# Patient Record
Sex: Male | Born: 1980 | Race: Asian | Hispanic: No | Marital: Single | Smoking: Former smoker
Health system: Southern US, Community
[De-identification: ages and names within clinical notes are randomized; demographics above are authoritative.]

## PROBLEM LIST (undated history)

## (undated) DIAGNOSIS — J189 Pneumonia, unspecified organism: Secondary | ICD-10-CM

## (undated) DIAGNOSIS — I209 Angina pectoris, unspecified: Secondary | ICD-10-CM

## (undated) DIAGNOSIS — R0600 Dyspnea, unspecified: Secondary | ICD-10-CM

## (undated) DIAGNOSIS — F419 Anxiety disorder, unspecified: Secondary | ICD-10-CM

## (undated) DIAGNOSIS — D649 Anemia, unspecified: Secondary | ICD-10-CM

## (undated) HISTORY — DX: Anemia, unspecified: D64.9

---

## 2012-11-08 ENCOUNTER — Encounter (HOSPITAL_BASED_OUTPATIENT_CLINIC_OR_DEPARTMENT_OTHER): Payer: Self-pay | Admitting: *Deleted

## 2012-11-08 ENCOUNTER — Emergency Department (HOSPITAL_BASED_OUTPATIENT_CLINIC_OR_DEPARTMENT_OTHER): Payer: Worker's Compensation

## 2012-11-08 ENCOUNTER — Emergency Department (HOSPITAL_BASED_OUTPATIENT_CLINIC_OR_DEPARTMENT_OTHER)
Admission: EM | Admit: 2012-11-08 | Discharge: 2012-11-08 | Disposition: A | Discharge status: Home / Self Care | Payer: Worker's Compensation | Attending: Emergency Medicine | Admitting: Emergency Medicine

## 2012-11-08 DIAGNOSIS — W268XXA Contact with other sharp object(s), not elsewhere classified, initial encounter: Secondary | ICD-10-CM | POA: Insufficient documentation

## 2012-11-08 DIAGNOSIS — F17200 Nicotine dependence, unspecified, uncomplicated: Secondary | ICD-10-CM | POA: Insufficient documentation

## 2012-11-08 DIAGNOSIS — S91109A Unspecified open wound of unspecified toe(s) without damage to nail, initial encounter: Secondary | ICD-10-CM | POA: Insufficient documentation

## 2012-11-08 DIAGNOSIS — S96909A Unspecified injury of unspecified muscle and tendon at ankle and foot level, unspecified foot, initial encounter: Secondary | ICD-10-CM | POA: Insufficient documentation

## 2012-11-08 DIAGNOSIS — IMO0002 Reserved for concepts with insufficient information to code with codable children: Secondary | ICD-10-CM

## 2012-11-08 DIAGNOSIS — Y9389 Activity, other specified: Secondary | ICD-10-CM | POA: Insufficient documentation

## 2012-11-08 DIAGNOSIS — Z23 Encounter for immunization: Secondary | ICD-10-CM | POA: Insufficient documentation

## 2012-11-08 DIAGNOSIS — S92919B Unspecified fracture of unspecified toe(s), initial encounter for open fracture: Secondary | ICD-10-CM | POA: Insufficient documentation

## 2012-11-08 DIAGNOSIS — Y9289 Other specified places as the place of occurrence of the external cause: Secondary | ICD-10-CM | POA: Insufficient documentation

## 2012-11-08 DIAGNOSIS — Y990 Civilian activity done for income or pay: Secondary | ICD-10-CM

## 2012-11-08 MED ORDER — CEFAZOLIN SODIUM 1-5 GM-% IV SOLN
1.0000 g | Freq: Once | INTRAVENOUS | Status: AC
  Administered 2012-11-08: 1 g via INTRAVENOUS

## 2012-11-08 MED ORDER — HYDROMORPHONE HCL PF 1 MG/ML IJ SOLN
1.0000 mg | Freq: Once | INTRAMUSCULAR | Status: AC
  Administered 2012-11-08: 1 mg via INTRAVENOUS

## 2012-11-08 MED ORDER — BUPIVACAINE HCL (PF) 0.5 % IJ SOLN
20.0000 mL | Freq: Once | INTRAMUSCULAR | Status: AC
  Administered 2012-11-08: 10 mL
  Filled 2012-11-08: qty 10

## 2012-11-08 MED ORDER — TETANUS-DIPHTH-ACELL PERTUSSIS 5-2.5-18.5 LF-MCG/0.5 IM SUSP
0.5000 mL | Freq: Once | INTRAMUSCULAR | Status: AC
  Administered 2012-11-08: 0.5 mL via INTRAMUSCULAR
  Filled 2012-11-08: qty 0.5

## 2012-11-08 MED ORDER — ONDANSETRON HCL 4 MG/2ML IJ SOLN
4.0000 mg | Freq: Once | INTRAMUSCULAR | Status: AC
  Administered 2012-11-08: 4 mg via INTRAVENOUS

## 2012-11-08 MED ORDER — OXYCODONE-ACETAMINOPHEN 5-325 MG PO TABS: 1.0000 | ORAL_TABLET | ORAL | Status: DC | PRN

## 2012-11-08 MED ORDER — IBUPROFEN 600 MG PO TABS: 600.0000 mg | ORAL_TABLET | Freq: Four times a day (QID) | ORAL | Status: DC | PRN

## 2012-11-08 MED ORDER — ONDANSETRON HCL 4 MG/2ML IJ SOLN
INTRAMUSCULAR | Status: AC
  Filled 2012-11-08: qty 2

## 2012-11-08 MED ORDER — HYDROMORPHONE HCL PF 1 MG/ML IJ SOLN
INTRAMUSCULAR | Status: AC
  Filled 2012-11-08: qty 1

## 2012-11-08 MED ORDER — CIPROFLOXACIN HCL 500 MG PO TABS: 500.0000 mg | ORAL_TABLET | Freq: Two times a day (BID) | ORAL | Status: DC

## 2012-11-08 MED ORDER — CEFAZOLIN SODIUM 1 G IJ SOLR
INTRAMUSCULAR | Status: AC
  Filled 2012-11-08: qty 10

## 2012-11-08 MED ORDER — AMOXICILLIN-POT CLAVULANATE 875-125 MG PO TABS: 1.0000 | ORAL_TABLET | Freq: Two times a day (BID) | ORAL | Status: DC

## 2012-11-08 NOTE — ED Notes (Signed)
Patient transported to X-ray 

## 2012-11-08 NOTE — ED Provider Notes (Signed)
History     CSN: [id number]  Arrival date & time 11/08/12  1607   First MD Initiated Contact with Patient 11/08/12 1632      Chief Complaint  Patient presents with  . Foot Injury    (Consider location/radiation/quality/duration/timing/severity/associated sxs/prior treatment) HPIChantha Velez is a 31 y.o. male was working at [employer]" and got a large, heavy metal rod on his left foot while wearing tennis shoes at work. Patient says it's the tissue he had some blood with her sawed, he is complaining of 10 out of 10 severe pain, sharp and throbbing, well localized to the left foot, this is worse on walking. The patient did not remove his shoe or sock until he came to the emergency department.   History reviewed. No pertinent past medical history.  History reviewed. No pertinent past surgical history.  History reviewed. No pertinent family history.  History  Substance Use Topics  . Smoking status: Current Every Day Smoker  . Smokeless tobacco: Not on file  . Alcohol Use:       Review of Systems At least 10pt or greater review of systems completed and are negative except where specified in the HPI.  Allergies  Review of patient's allergies indicates no known allergies.  Home Medications  No current outpatient prescriptions on file.  BP 133/87  Pulse 97  Temp 97.9 F (36.6 C) (Oral)  Resp 18  SpO2 100%  Physical Exam  Nursing notes reviewed.  Electronic medical record reviewed. VITAL SIGNS:   Filed Vitals:   11/08/12 1617  BP: 133/87  Pulse: 97  Temp: 97.9 F (36.6 C)  TempSrc: Oral  Resp: 18  SpO2: 100%   CONSTITUTIONAL: Awake, oriented, appears non-toxic HENT: Atraumatic, normocephalic, oral mucosa pink and moist, airway patent. Nares patent without drainage. External ears normal. EYES: Conjunctiva clear, EOMI, PERRLA NECK: Trachea midline, non-tender, supple CARDIOVASCULAR: Normal heart rate, Normal rhythm, No murmurs, rubs, gallops PULMONARY/CHEST:  Clear to auscultation, no rhonchi, wheezes, or rales. Symmetrical breath sounds. Non-tender. ABDOMINAL: Non-distended, soft, non-tender - no rebound or guarding.  BS normal. NEUROLOGIC: Non-focal, moving all four extremities, no gross sensory or motor deficits. EXTREMITIES: No clubbing, cyanosis, or edema. Second toe on left foot has multiple lacerations, toenail, and nailbed is disrupted, swollen and painful to touch. Bone fragment seen in nailbed. Sensation intact, patient is able to wiggle all of his toes. SKIN: Warm, Dry, No erythema, No rash  ED Course  LACERATION REPAIR Performed by: Christopher Velez Authorized by: Christopher Velez Consent: Verbal consent obtained. Risks and benefits: risks, benefits and alternatives were discussed Consent given by: patient Patient identity confirmed: verbally with patient Time out: Immediately prior to procedure a "time out" was called to verify the correct patient, procedure, equipment, support staff and site/side marked as required. Body area: lower extremity Location details: left second toe Laceration length: 3 cm Foreign bodies: no foreign bodies Tendon involvement: No obvious tendon damage on the dorsal aspect. Vascular damage: no Anesthesia: digital block Local anesthetic: bupivacaine 0.5% without epinephrine Patient sedated: no Preparation: Patient was prepped and draped in the usual sterile fashion. Irrigation solution: tap water Irrigation method: tap Amount of cleaning: extensive (10 minutes under tap) Debridement: minimal Degree of undermining: none Skin closure: 4-0 nylon Subcutaneous closure: 6-0 Chromic gut (Nail bed) Number of sutures: 4 Technique: simple Approximation: loose Approximation difficulty: complex Dressing: antibiotic ointment and 4x4 sterile gauze Patient tolerance: Patient tolerated the procedure well with no immediate complications.   (including critical care time)  Labs  Reviewed - No data to display Dg  Foot Complete Left  11/08/2012  *RADIOLOGY REPORT*  Clinical Data: Dropped heavy object on foot  LEFT FOOT - COMPLETE 3+ VIEW  Comparison: None.  Findings: There is crush fracture of the distal phalanx of the left second digit with soft tissue swelling.  The fracture does extend to the left second DIP joint space.  No other acute abnormality is seen.  Tarsal - metatarsal alignment is normal.  IMPRESSION: Crush fracture of the distal phalanx of the left second digit extending intra-articular.   Original Report Authenticated By: Christopher Velez, M.D.      1. Open fracture of distal phalangeal tuft   2. Nailbed injury   3. Work related injury     Medications  oxyCODONE-acetaminophen (PERCOCET/ROXICET) 5-325 MG per tablet (not administered)  ibuprofen (ADVIL,MOTRIN) 600 MG tablet (not administered)  amoxicillin-clavulanate (AUGMENTIN) 875-125 MG per tablet (not administered)  ciprofloxacin (CIPRO) 500 MG tablet (not administered)  TDaP (BOOSTRIX) injection 0.5 mL (0.5 mL Intramuscular Given 11/08/12 1640)  ondansetron (ZOFRAN) injection 4 mg (4 mg Intravenous Given 11/08/12 1641)  HYDROmorphone (DILAUDID) injection 1 mg (1 mg Intravenous Given 11/08/12 1641)  bupivacaine (MARCAINE) 0.5 % injection 20 mL (10 mL Infiltration Given 11/08/12 1705)  ceFAZolin (ANCEF) IVPB 1 g/50 mL premix (0 g Intravenous Stopped 11/08/12 1815)    MDM  Christopher Velez is a 31 y.o. male presents with severe toe injury. Given the patient IV antibiotics. Have extensively irrigated the patient's wound 10 minutes under the tap after a digital block. The nail is disrupted and removed, is not amenable to placing back on top of the nail bed. It is a good likelihood of the germinal matrix being damaged and not sure the nail will grow back. Discussed patient with Dr. Ranell Patrick on call who advised giving Augmentin by mouth as an outpatient.  Patient received Ancef and by mouth Cipro in the ER.  Nailbed is significantly injured this was  approximated with a few small gauge chromic gut sutures. One suture was placed through the large laceration through the pulp of the second digit - to help approximate but still allow the tissue to swell and drain.  Patient is [place] but speaks good English and does understand reasons to return to the emergency department including times of infection or neurovascular compromise.  I explained the diagnosis and have given explicit precautions to return to the ER including any other new or worsening symptoms. The patient understands and accepts the medical plan as it's been dictated and I have answered their questions. Discharge instructions concerning home care and prescriptions have been given.  The patient is STABLE and is discharged to home in good condition.          Christopher Skene, MD 11/10/12 [phone]

## 2012-11-08 NOTE — ED Notes (Signed)
Pt to triage in w/c, reporting dropped a large, heavy, metal object onto his left foot at work. Pt noticed it split his shoe and when he removed his shoe saw blood. Shoe is in place at triage.

## 2018-03-10 ENCOUNTER — Other Ambulatory Visit: Payer: Self-pay

## 2018-03-10 ENCOUNTER — Emergency Department (HOSPITAL_COMMUNITY): Payer: Self-pay

## 2018-03-10 ENCOUNTER — Encounter (HOSPITAL_COMMUNITY): Payer: Self-pay

## 2018-03-10 ENCOUNTER — Inpatient Hospital Stay (HOSPITAL_COMMUNITY)
Admission: EM | Admit: 2018-03-10 | Discharge: 2018-03-15 | DRG: 871 | Disposition: A | Discharge status: Home / Self Care | Payer: Self-pay | Attending: Internal Medicine | Admitting: Internal Medicine

## 2018-03-10 DIAGNOSIS — F17200 Nicotine dependence, unspecified, uncomplicated: Secondary | ICD-10-CM | POA: Diagnosis present

## 2018-03-10 DIAGNOSIS — E876 Hypokalemia: Secondary | ICD-10-CM | POA: Diagnosis present

## 2018-03-10 DIAGNOSIS — E871 Hypo-osmolality and hyponatremia: Secondary | ICD-10-CM

## 2018-03-10 DIAGNOSIS — A4189 Other specified sepsis: Principal | ICD-10-CM | POA: Diagnosis present

## 2018-03-10 DIAGNOSIS — J1008 Influenza due to other identified influenza virus with other specified pneumonia: Secondary | ICD-10-CM | POA: Diagnosis present

## 2018-03-10 DIAGNOSIS — Z79891 Long term (current) use of opiate analgesic: Secondary | ICD-10-CM

## 2018-03-10 DIAGNOSIS — R748 Abnormal levels of other serum enzymes: Secondary | ICD-10-CM | POA: Diagnosis present

## 2018-03-10 DIAGNOSIS — Z791 Long term (current) use of non-steroidal anti-inflammatories (NSAID): Secondary | ICD-10-CM

## 2018-03-10 DIAGNOSIS — J9601 Acute respiratory failure with hypoxia: Secondary | ICD-10-CM

## 2018-03-10 DIAGNOSIS — Z79899 Other long term (current) drug therapy: Secondary | ICD-10-CM

## 2018-03-10 DIAGNOSIS — A419 Sepsis, unspecified organism: Secondary | ICD-10-CM

## 2018-03-10 DIAGNOSIS — E872 Acidosis: Secondary | ICD-10-CM | POA: Diagnosis present

## 2018-03-10 DIAGNOSIS — Z792 Long term (current) use of antibiotics: Secondary | ICD-10-CM

## 2018-03-10 DIAGNOSIS — F10230 Alcohol dependence with withdrawal, uncomplicated: Secondary | ICD-10-CM | POA: Diagnosis present

## 2018-03-10 DIAGNOSIS — J189 Pneumonia, unspecified organism: Secondary | ICD-10-CM | POA: Diagnosis present

## 2018-03-10 DIAGNOSIS — E222 Syndrome of inappropriate secretion of antidiuretic hormone: Secondary | ICD-10-CM | POA: Diagnosis present

## 2018-03-10 DIAGNOSIS — D1803 Hemangioma of intra-abdominal structures: Secondary | ICD-10-CM | POA: Diagnosis present

## 2018-03-10 DIAGNOSIS — J181 Lobar pneumonia, unspecified organism: Secondary | ICD-10-CM | POA: Diagnosis present

## 2018-03-10 DIAGNOSIS — K760 Fatty (change of) liver, not elsewhere classified: Secondary | ICD-10-CM | POA: Diagnosis present

## 2018-03-10 DIAGNOSIS — J111 Influenza due to unidentified influenza virus with other respiratory manifestations: Secondary | ICD-10-CM

## 2018-03-10 DIAGNOSIS — D649 Anemia, unspecified: Secondary | ICD-10-CM | POA: Diagnosis present

## 2018-03-10 DIAGNOSIS — F10930 Alcohol use, unspecified with withdrawal, uncomplicated: Secondary | ICD-10-CM

## 2018-03-10 DIAGNOSIS — J209 Acute bronchitis, unspecified: Secondary | ICD-10-CM | POA: Diagnosis present

## 2018-03-10 DIAGNOSIS — R945 Abnormal results of liver function studies: Secondary | ICD-10-CM | POA: Diagnosis present

## 2018-03-10 HISTORY — DX: Pneumonia, unspecified organism: J18.9

## 2018-03-10 HISTORY — DX: Angina pectoris, unspecified: I20.9

## 2018-03-10 HISTORY — DX: Dyspnea, unspecified: R06.00

## 2018-03-10 HISTORY — DX: Anxiety disorder, unspecified: F41.9

## 2018-03-10 LAB — RAPID URINE DRUG SCREEN, HOSP PERFORMED
Amphetamines: NOT DETECTED
BENZODIAZEPINES: NOT DETECTED
Barbiturates: NOT DETECTED
Cocaine: NOT DETECTED
OPIATES: NOT DETECTED
Tetrahydrocannabinol: NOT DETECTED

## 2018-03-10 LAB — URINALYSIS, ROUTINE W REFLEX MICROSCOPIC
Glucose, UA: NEGATIVE mg/dL
Ketones, ur: 5 mg/dL — AB
Leukocytes, UA: NEGATIVE
Nitrite: NEGATIVE
PROTEIN: 30 mg/dL — AB
SQUAMOUS EPITHELIAL / LPF: NONE SEEN
Specific Gravity, Urine: 1.01 (ref 1.005–1.030)
pH: 6 (ref 5.0–8.0)

## 2018-03-10 LAB — INFLUENZA PANEL BY PCR (TYPE A & B)
INFLAPCR: POSITIVE — AB
Influenza B By PCR: NEGATIVE

## 2018-03-10 LAB — CBC WITH DIFFERENTIAL/PLATELET
Band Neutrophils: 16 %
Basophils Absolute: 0 10*3/uL (ref 0.0–0.1)
Basophils Relative: 0 %
Eosinophils Absolute: 0 10*3/uL (ref 0.0–0.7)
Eosinophils Relative: 0 %
HCT: 34.3 % — ABNORMAL LOW (ref 39.0–52.0)
Hemoglobin: 11.5 g/dL — ABNORMAL LOW (ref 13.0–17.0)
LYMPHS PCT: 8 %
Lymphs Abs: 1.9 10*3/uL (ref 0.7–4.0)
MCH: 25.3 pg — AB (ref 26.0–34.0)
MCHC: 33.5 g/dL (ref 30.0–36.0)
MCV: 75.6 fL — ABNORMAL LOW (ref 78.0–100.0)
MONOS PCT: 18 %
Monocytes Absolute: 4.2 10*3/uL — ABNORMAL HIGH (ref 0.1–1.0)
NEUTROS ABS: 17.2 10*3/uL — AB (ref 1.7–7.7)
Neutrophils Relative %: 58 %
Platelets: 399 10*3/uL (ref 150–400)
RBC: 4.54 MIL/uL (ref 4.22–5.81)
RDW: 12.9 % (ref 11.5–15.5)
WBC: 23.3 10*3/uL — AB (ref 4.0–10.5)

## 2018-03-10 LAB — COMPREHENSIVE METABOLIC PANEL
ALBUMIN: 1.9 g/dL — AB (ref 3.5–5.0)
ALT: 58 U/L (ref 17–63)
AST: 79 U/L — AB (ref 15–41)
Alkaline Phosphatase: 129 U/L — ABNORMAL HIGH (ref 38–126)
Anion gap: 13 (ref 5–15)
BUN: 12 mg/dL (ref 6–20)
CHLORIDE: 98 mmol/L — AB (ref 101–111)
CO2: 17 mmol/L — ABNORMAL LOW (ref 22–32)
CREATININE: 0.73 mg/dL (ref 0.61–1.24)
Calcium: 7.7 mg/dL — ABNORMAL LOW (ref 8.9–10.3)
GFR calc Af Amer: >60 mL/min (ref >60)
GLUCOSE: 135 mg/dL — AB (ref 65–99)
POTASSIUM: 3.6 mmol/L (ref 3.5–5.1)
Sodium: 128 mmol/L — ABNORMAL LOW (ref 135–145)
Total Bilirubin: 3.6 mg/dL — ABNORMAL HIGH (ref 0.3–1.2)
Total Protein: 6.1 g/dL — ABNORMAL LOW (ref 6.5–8.1)

## 2018-03-10 LAB — I-STAT VENOUS BLOOD GAS, ED
ACID-BASE DEFICIT: 5 mmol/L — AB (ref 0.0–2.0)
Bicarbonate: 21.2 mmol/L (ref 20.0–28.0)
O2 SAT: 92 %
TCO2: 23 mmol/L (ref 22–32)
pCO2, Ven: 44.5 mmHg (ref 44.0–60.0)
pH, Ven: 7.287 (ref 7.250–7.430)
pO2, Ven: 70 mmHg — ABNORMAL HIGH (ref 32.0–45.0)

## 2018-03-10 LAB — CORTISOL: Cortisol, Plasma: 16 ug/dL

## 2018-03-10 LAB — PROTIME-INR
INR: 1.1
PROTHROMBIN TIME: 14.2 s (ref 11.4–15.2)

## 2018-03-10 LAB — I-STAT CG4 LACTIC ACID, ED
LACTIC ACID, VENOUS: 2.6 mmol/L — AB (ref 0.5–1.9)
LACTIC ACID, VENOUS: 2.11 mmol/L — AB (ref 0.5–1.9)

## 2018-03-10 LAB — I-STAT ARTERIAL BLOOD GAS, ED
ACID-BASE DEFICIT: 6 mmol/L — AB (ref 0.0–2.0)
Bicarbonate: 19.2 mmol/L — ABNORMAL LOW (ref 20.0–28.0)
O2 Saturation: 99 %
PH ART: 7.345 — AB (ref 7.350–7.450)
Patient temperature: 99.7
TCO2: 20 mmol/L — ABNORMAL LOW (ref 22–32)
pCO2 arterial: 35.4 mmHg (ref 32.0–48.0)
pO2, Arterial: 173 mmHg — ABNORMAL HIGH (ref 83.0–108.0)

## 2018-03-10 LAB — SODIUM, URINE, RANDOM

## 2018-03-10 LAB — TYPE AND SCREEN
ABO/RH(D): O POS
ANTIBODY SCREEN: NEGATIVE

## 2018-03-10 LAB — ABO/RH: ABO/RH(D): O POS

## 2018-03-10 LAB — BASIC METABOLIC PANEL
ANION GAP: 17 — AB (ref 5–15)
BUN: 13 mg/dL (ref 6–20)
CO2: 17 mmol/L — ABNORMAL LOW (ref 22–32)
Calcium: 8.8 mg/dL — ABNORMAL LOW (ref 8.9–10.3)
Chloride: 89 mmol/L — ABNORMAL LOW (ref 101–111)
Creatinine, Ser: 0.72 mg/dL (ref 0.61–1.24)
GFR calc Af Amer: >60 mL/min (ref >60)
Glucose, Bld: 94 mg/dL (ref 65–99)
POTASSIUM: 2.9 mmol/L — AB (ref 3.5–5.1)
SODIUM: 123 mmol/L — AB (ref 135–145)

## 2018-03-10 LAB — GLUCOSE, CAPILLARY: GLUCOSE-CAPILLARY: 159 mg/dL — AB (ref 65–99)

## 2018-03-10 LAB — PROCALCITONIN: PROCALCITONIN: 1.44 ng/mL

## 2018-03-10 LAB — TROPONIN I: Troponin I: <0.03 ng/mL (ref <0.03)

## 2018-03-10 LAB — I-STAT TROPONIN, ED: TROPONIN I, POC: 0 ng/mL (ref 0.00–0.08)

## 2018-03-10 LAB — APTT: APTT: 35 s (ref 24–36)

## 2018-03-10 LAB — OSMOLALITY, URINE: OSMOLALITY UR: 276 mosm/kg — AB (ref 300–900)

## 2018-03-10 LAB — LACTIC ACID, PLASMA: Lactic Acid, Venous: 1.8 mmol/L (ref 0.5–1.9)

## 2018-03-10 MED ORDER — POTASSIUM CHLORIDE 10 MEQ/100ML IV SOLN
10.0000 meq | INTRAVENOUS | Status: DC
  Filled 2018-03-10: qty 100

## 2018-03-10 MED ORDER — SODIUM CHLORIDE 0.9 % IV SOLN: INTRAVENOUS | Status: DC

## 2018-03-10 MED ORDER — POTASSIUM CHLORIDE IN NACL 40-0.9 MEQ/L-% IV SOLN
INTRAVENOUS | Status: DC
  Administered 2018-03-10: 100 mL/h via INTRAVENOUS
  Filled 2018-03-10 (×2): qty 1000

## 2018-03-10 MED ORDER — IPRATROPIUM-ALBUTEROL 0.5-2.5 (3) MG/3ML IN SOLN
3.0000 mL | Freq: Once | RESPIRATORY_TRACT | Status: AC
  Administered 2018-03-10: 3 mL via RESPIRATORY_TRACT
  Filled 2018-03-10: qty 3

## 2018-03-10 MED ORDER — LACTATED RINGERS IV BOLUS
1000.0000 mL | Freq: Once | INTRAVENOUS | Status: AC
  Administered 2018-03-10: 1000 mL via INTRAVENOUS

## 2018-03-10 MED ORDER — POTASSIUM CHLORIDE CRYS ER 20 MEQ PO TBCR
40.0000 meq | EXTENDED_RELEASE_TABLET | Freq: Once | ORAL | Status: AC
  Administered 2018-03-10: 40 meq via ORAL
  Filled 2018-03-10: qty 2

## 2018-03-10 MED ORDER — OSELTAMIVIR PHOSPHATE 75 MG PO CAPS
75.0000 mg | ORAL_CAPSULE | Freq: Two times a day (BID) | ORAL | Status: AC
  Administered 2018-03-10 – 2018-03-15 (×10): 75 mg via ORAL
  Filled 2018-03-10 (×12): qty 1

## 2018-03-10 MED ORDER — FOLIC ACID 1 MG PO TABS
1.0000 mg | ORAL_TABLET | Freq: Every day | ORAL | Status: DC
  Administered 2018-03-10 – 2018-03-15 (×6): 1 mg via ORAL
  Filled 2018-03-10 (×6): qty 1

## 2018-03-10 MED ORDER — SODIUM CHLORIDE 0.9 % IV SOLN
500.0000 mg | Freq: Once | INTRAVENOUS | Status: AC
  Administered 2018-03-10: 500 mg via INTRAVENOUS
  Filled 2018-03-10: qty 500

## 2018-03-10 MED ORDER — SODIUM CHLORIDE 0.9 % IV SOLN
2.0000 g | Freq: Once | INTRAVENOUS | Status: AC
  Administered 2018-03-10: 2 g via INTRAVENOUS
  Filled 2018-03-10: qty 20

## 2018-03-10 MED ORDER — ADULT MULTIVITAMIN W/MINERALS CH
1.0000 | ORAL_TABLET | Freq: Every day | ORAL | Status: DC
  Administered 2018-03-10 – 2018-03-15 (×6): 1 via ORAL
  Filled 2018-03-10 (×6): qty 1

## 2018-03-10 MED ORDER — ORAL CARE MOUTH RINSE
15.0000 mL | Freq: Two times a day (BID) | OROMUCOSAL | Status: DC
  Administered 2018-03-10 – 2018-03-12 (×4): 15 mL via OROMUCOSAL

## 2018-03-10 MED ORDER — SODIUM CHLORIDE 0.9 % IV BOLUS (SEPSIS)
250.0000 mL | Freq: Once | INTRAVENOUS | Status: AC
  Administered 2018-03-10: 250 mL via INTRAVENOUS

## 2018-03-10 MED ORDER — POTASSIUM CHLORIDE 10 MEQ/100ML IV SOLN
10.0000 meq | INTRAVENOUS | Status: AC
  Administered 2018-03-10 (×2): 10 meq via INTRAVENOUS
  Filled 2018-03-10: qty 100

## 2018-03-10 MED ORDER — ACETAMINOPHEN 500 MG PO TABS
1000.0000 mg | ORAL_TABLET | Freq: Once | ORAL | Status: AC
  Administered 2018-03-10: 1000 mg via ORAL
  Filled 2018-03-10: qty 2

## 2018-03-10 MED ORDER — LORAZEPAM 2 MG/ML IJ SOLN
1.0000 mg | INTRAMUSCULAR | Status: DC | PRN
  Administered 2018-03-10: 1 mg via INTRAVENOUS
  Administered 2018-03-11: 2 mg via INTRAVENOUS
  Filled 2018-03-10 (×2): qty 1

## 2018-03-10 MED ORDER — METHYLPREDNISOLONE SODIUM SUCC 125 MG IJ SOLR
125.0000 mg | Freq: Once | INTRAMUSCULAR | Status: AC
Start: 2018-03-10 — End: 2018-03-10
  Administered 2018-03-10: 125 mg via INTRAVENOUS
  Filled 2018-03-10: qty 2

## 2018-03-10 MED ORDER — VITAMIN B-1 100 MG PO TABS
100.0000 mg | ORAL_TABLET | Freq: Every day | ORAL | Status: DC
  Administered 2018-03-10 – 2018-03-15 (×6): 100 mg via ORAL
  Filled 2018-03-10 (×6): qty 1

## 2018-03-10 MED ORDER — SODIUM CHLORIDE 0.9 % IV BOLUS (SEPSIS)
1000.0000 mL | Freq: Once | INTRAVENOUS | Status: AC
  Administered 2018-03-10: 1000 mL via INTRAVENOUS

## 2018-03-10 MED ORDER — LACTATED RINGERS IV SOLN
INTRAVENOUS | Status: DC
  Administered 2018-03-10: 20:00:00 via INTRAVENOUS

## 2018-03-10 MED ORDER — IPRATROPIUM-ALBUTEROL 0.5-2.5 (3) MG/3ML IN SOLN
3.0000 mL | Freq: Four times a day (QID) | RESPIRATORY_TRACT | Status: DC
  Administered 2018-03-10 – 2018-03-11 (×5): 3 mL via RESPIRATORY_TRACT
  Filled 2018-03-10 (×5): qty 3

## 2018-03-10 MED ORDER — GUAIFENESIN ER 600 MG PO TB12
600.0000 mg | ORAL_TABLET | Freq: Two times a day (BID) | ORAL | Status: DC
  Administered 2018-03-10 – 2018-03-14 (×8): 600 mg via ORAL
  Filled 2018-03-10 (×9): qty 1

## 2018-03-10 MED ORDER — ALBUTEROL SULFATE (2.5 MG/3ML) 0.083% IN NEBU
5.0000 mg | INHALATION_SOLUTION | Freq: Once | RESPIRATORY_TRACT | Status: AC
  Administered 2018-03-10: 5 mg via RESPIRATORY_TRACT
  Filled 2018-03-10: qty 6

## 2018-03-10 NOTE — ED Triage Notes (Signed)
Pt presents with 1 week h/o mid-sternal chest pain, shortness of breath and cough.  Pt reports pain worsens with movement and deep inspiration.  Pt reports diaphoresis (visibly so in triage). Pt reports cough is productive with green/ yellow phlegm.

## 2018-03-10 NOTE — ED Provider Notes (Signed)
[facility] Provider Note   CSN: [id number] Arrival date & time: 03/10/18  1418     History   Chief Complaint Chief Complaint  Patient presents with  . Shortness of Breath  . Chest Pain    HPI Trust Leh is a 37 y.o. male.  HPI 37 year old male here with cough and generalized weakness.  The patient symptoms started last week.  He states that last week, he began to develop fever, chills, and body aches.  Over the last 2 days, he said acute worsening of symptoms with cough and shortness of breath.  He said associated severe generalized weakness.  Said difficulty even walking across his room.  Is also noticed some occasional wheezing.  He does smoke tobacco regularly.  He drinks alcohol regularly as well.  No known sick contacts.  No recent hospitalizations or antibiotic use.  No recent prolonged immobilizations.  No history of blood clots.  History reviewed. No pertinent past medical history.  There are no active problems to display for this patient.   History reviewed. No pertinent surgical history.      Home Medications    Prior to Admission medications   Medication Sig Start Date End Date Taking? Authorizing Provider  amoxicillin-clavulanate (AUGMENTIN) 875-125 MG per tablet Take 1 tablet by mouth 2 (two) times daily. 11/08/12   Bonk, Harrington Challenger, MD  ciprofloxacin (CIPRO) 500 MG tablet Take 1 tablet (500 mg total) by mouth every 12 (twelve) hours. 11/08/12   [place]k, Harrington Challenger, MD  ibuprofen (ADVIL,MOTRIN) 600 MG tablet Take 1 tablet (600 mg total) by mouth every 6 (six) hours as needed for pain. 11/08/12   [place], Harrington Challenger, MD  oxyCODONE-acetaminophen (PERCOCET/ROXICET) 5-325 MG per tablet Take 1-2 tablets by mouth every 4 (four) hours as needed for pain. 11/08/12   Rhunette Croft, MD    Family History History reviewed. No pertinent family history.  Social History Social History   Tobacco Use  . Smoking status: Current Every Day  Smoker  . Smokeless tobacco: Never Used  Substance Use Topics  . Alcohol use: Not on file  . Drug use: Not on file     Allergies   Patient has no known allergies.   Review of Systems Review of Systems  Constitutional: Positive for chills, fatigue and fever.  Respiratory: Positive for cough, shortness of breath and wheezing.   Neurological: Positive for weakness.  All other systems reviewed and are negative.    Physical Exam Updated Vital Signs BP 123/86 (BP Location: Left Arm)   Pulse 93   Temp 98.1 F (36.7 C) (Axillary)   Resp (!) 38   Ht 5\' 8"  (1.727 m)   Wt 74.8 kg (165 lb)   SpO2 100%   BMI 25.09 kg/m   Physical Exam  Constitutional: He is oriented to person, place, and time. He appears well-developed and well-nourished. He appears ill. He appears distressed.  HENT:  Head: Normocephalic and atraumatic.  Dry mucous membranes  Eyes: Conjunctivae are normal.  Neck: Neck supple.  Cardiovascular: Normal rate, regular rhythm and normal heart sounds. Exam reveals no friction rub.  No murmur heard. Pulmonary/Chest: Accessory muscle usage present. Tachypnea noted. No respiratory distress. He has decreased breath sounds. He has wheezes. He has rhonchi in the right middle field, the left middle field and the left lower field. He has no rales.  Abdominal: He exhibits no distension.  Musculoskeletal: He exhibits no edema.  Neurological: He is alert and oriented to person, place, and time.  He exhibits normal muscle tone.  Skin: Skin is warm. Capillary refill takes less than 2 seconds.  Psychiatric: He has a normal mood and affect.  Nursing note and vitals reviewed.    ED Treatments / Results  Labs (all labs ordered are listed, but only abnormal results are displayed) Labs Reviewed  CBC WITH DIFFERENTIAL/PLATELET - Abnormal; Notable for the following components:      Result Value   WBC 23.3 (*)    Hemoglobin 11.5 (*)    HCT 34.3 (*)    MCV 75.6 (*)    MCH 25.3 (*)     Neutro Abs 17.2 (*)    Monocytes Absolute 4.2 (*)    All other components within normal limits  BASIC METABOLIC PANEL - Abnormal; Notable for the following components:   Sodium 123 (*)    Potassium 2.9 (*)    Chloride 89 (*)    CO2 17 (*)    Calcium 8.8 (*)    Anion gap 17 (*)    All other components within normal limits  I-STAT CG4 LACTIC ACID, ED - Abnormal; Notable for the following components:   Lactic Acid, Venous 2.60 (*)    All other components within normal limits  I-STAT CG4 LACTIC ACID, ED - Abnormal; Notable for the following components:   Lactic Acid, Venous 2.11 (*)    All other components within normal limits  I-STAT VENOUS BLOOD GAS, ED - Abnormal; Notable for the following components:   pO2, Ven 70.0 (*)    Acid-base deficit 5.0 (*)    All other components within normal limits  I-STAT ARTERIAL BLOOD GAS, ED - Abnormal; Notable for the following components:   pH, Arterial 7.345 (*)    pO2, Arterial 173.0 (*)    Bicarbonate 19.2 (*)    TCO2 20 (*)    Acid-base deficit 6.0 (*)    All other components within normal limits  CULTURE, BLOOD (ROUTINE X 2)  CULTURE, BLOOD (ROUTINE X 2)  CULTURE, EXPECTORATED SPUTUM-ASSESSMENT  URINALYSIS, ROUTINE W REFLEX MICROSCOPIC  INFLUENZA PANEL BY PCR (TYPE A & B)  BLOOD GAS, ARTERIAL  COMPREHENSIVE METABOLIC PANEL  LACTIC ACID, PLASMA  LACTIC ACID, PLASMA  CORTISOL  TROPONIN I  PROTIME-INR  PROCALCITONIN  APTT  BASIC METABOLIC PANEL  BASIC METABOLIC PANEL  OSMOLALITY, URINE  SODIUM, URINE, RANDOM  RAPID URINE DRUG SCREEN, HOSP PERFORMED  I-STAT TROPONIN, ED  TYPE AND SCREEN    EKG EKG Interpretation  Date/Time:  Saturday March 10 2018 16:11:09 EDT Ventricular Rate:  99 PR Interval:    QRS Duration: 96 QT Interval:  396 QTC Calculation: 509 R Axis:   70 Text Interpretation:  Sinus rhythm Left ventricular hypertrophy Borderline T abnormalities, anterior leads Prolonged QT interval Baseline wander in lead(s)  V1 No significant change since last tracing Confirmed by Duffy Bruce [phone]) on 03/10/2018 5:50:03 PM   Radiology Dg Chest 2 View  Result Date: 03/10/2018 CLINICAL DATA:  Chest pain and shortness of breath EXAM: CHEST - 2 VIEW COMPARISON:  None. FINDINGS: There is patchy airspace consolidation in the left base and the right middle lobe. Lungs elsewhere clear. Heart size and pulmonary vascularity are normal. No adenopathy. No bone lesions. IMPRESSION: Infiltrate consistent with pneumonia left lower lobe and right middle lobe. Lungs elsewhere clear. No adenopathy evident. Electronically Signed   By: Lowella Grip III M.D.   On: 03/10/2018 15:36    Procedures .Critical Care Performed by: Duffy Bruce, MD Authorized by: Duffy Bruce, MD  Critical care provider statement:    Critical care time (minutes):  45   Critical care time was exclusive of:  Separately billable procedures and treating other patients and teaching time   Critical care was necessary to treat or prevent imminent or life-threatening deterioration of the following conditions:  Circulatory failure, sepsis, respiratory failure and dehydration   Critical care was time spent personally by me on the following activities:  Development of treatment plan with patient or surrogate, discussions with consultants, evaluation of patient's response to treatment, examination of patient, obtaining history from patient or surrogate, ordering and performing treatments and interventions, ordering and review of laboratory studies, ordering and review of radiographic studies, pulse oximetry, re-evaluation of patient's condition and review of old charts   I assumed direction of critical care for this patient from another provider in my specialty: no     (including critical care time)  Medications Ordered in ED Medications  potassium chloride 10 mEq in 100 mL IVPB (10 mEq Intravenous New Bag/Given 03/10/18 1934)  LORazepam (ATIVAN)  injection 1-2 mg (has no administration in time range)  multivitamin with minerals tablet 1 tablet (1 tablet Oral Given 6/56/81 2751)  folic acid (FOLVITE) tablet 1 mg (1 mg Oral Given 03/10/18 1928)  thiamine (VITAMIN B-1) tablet 100 mg (100 mg Oral Given 03/10/18 1928)  lactated ringers infusion ( Intravenous New Bag/Given 03/10/18 1938)  ipratropium-albuterol (DUONEB) 0.5-2.5 (3) MG/3ML nebulizer solution 3 mL (3 mLs Nebulization Given 03/10/18 1920)  guaiFENesin (MUCINEX) 12 hr tablet 600 mg (has no administration in time range)  0.9 %  sodium chloride infusion (has no administration in time range)  potassium chloride 10 mEq in 100 mL IVPB (has no administration in time range)  albuterol (PROVENTIL) (2.5 MG/3ML) 0.083% nebulizer solution 5 mg (5 mg Nebulization Given 03/10/18 1449)  cefTRIAXone (ROCEPHIN) 2 g in sodium chloride 0.9 % 100 mL IVPB (0 g Intravenous Stopped 03/10/18 1739)  azithromycin (ZITHROMAX) 500 mg in sodium chloride 0.9 % 250 mL IVPB (0 mg Intravenous Stopped 03/10/18 1808)  sodium chloride 0.9 % bolus 1,000 mL (0 mLs Intravenous Stopped 03/10/18 1807)    And  sodium chloride 0.9 % bolus 1,000 mL (0 mLs Intravenous Stopped 03/10/18 1706)    And  sodium chloride 0.9 % bolus 250 mL (0 mLs Intravenous Stopped 03/10/18 1707)  acetaminophen (TYLENOL) tablet 1,000 mg (1,000 mg Oral Given 03/10/18 1626)  ipratropium-albuterol (DUONEB) 0.5-2.5 (3) MG/3ML nebulizer solution 3 mL (3 mLs Nebulization Given 03/10/18 1626)  methylPREDNISolone sodium succinate (SOLU-MEDROL) 125 mg/2 mL injection 125 mg (125 mg Intravenous Given 03/10/18 1647)  potassium chloride SA (K-DUR,KLOR-CON) CR tablet 40 mEq (40 mEq Oral Given 03/10/18 1814)  lactated ringers bolus 1,000 mL (0 mLs Intravenous Stopped 03/10/18 1929)  ipratropium-albuterol (DUONEB) 0.5-2.5 (3) MG/3ML nebulizer solution 3 mL (3 mLs Nebulization Given 03/10/18 1815)     Initial Impression / Assessment and Plan / ED Course  I have reviewed the  triage vital signs and the nursing notes.  Pertinent labs & imaging results that were available during my care of the patient were reviewed by me and considered in my medical decision making (see chart for details).     37 year old male here with fever, cough, and shortness of breath.  Lab work significant for significant leukocytosis with left shift, likely hypovolemic hyponatremia, and moderate lactic acidosis.  Patient activated as a code sepsis and has been given broad-spectrum antibiotics.  Of also given him steroids and breathing treatments.  Despite this,  he remains significantly tachypneic.  I suspect his symptoms are due to bilateral pneumonia which his chest x-ray confirms.  Given his persistent tachypnea with high potential of worsening of his respiratory condition, I consulted the intensivist who will admit.  Final Clinical Impressions(s) / ED Diagnoses   Final diagnoses:  Acute respiratory failure with hypoxia ([place])  Sepsis due to pneumonia [facility])  Hyponatremia    ED Discharge Orders    None       Duffy Bruce, MD 03/10/18 1944

## 2018-03-10 NOTE — ED Notes (Signed)
Attempted to call report; unable to take at this time and will call me back.

## 2018-03-10 NOTE — H&P (Signed)
PULMONARY / CRITICAL CARE MEDICINE   Name: Christopher Velez MRN: [id number] DOB: October 27, 1981    ADMISSION DATE:  03/10/2018 CONSULTATION DATE:  03/10/18  REFERRING MD: ED. Dr. Ellender Hose  CHIEF COMPLAINT:  Shortness of breath , copugh  HISTORY OF PRESENT ILLNESS:   [name] has been ill for several days.he thought he had the flu. He is feeling worse each day over the past several days. He has a dry hacking cough. Hs 02 sat in the ED has gone down at times into the high 80s. His respiratory rate is about 40 and he is breathing shallow. His BP is presently 119/87. He does not take any meds on a regular basis. He denied significant fever or chills. The patient drinks at least 48 oz of beer on a regualr basis. He denies night sweats or hemoptysis.  He denies illicit drug use  PAST MEDICAL HISTORY :  He  has no past medical history on file.  PAST SURGICAL HISTORY: He  has no past surgical history on file.  No Known Allergies  No current facility-administered medications on file prior to encounter.    Current Outpatient Medications on File Prior to Encounter  Medication Sig  . amoxicillin-clavulanate (AUGMENTIN) 875-125 MG per tablet Take 1 tablet by mouth 2 (two) times daily.  . ciprofloxacin (CIPRO) 500 MG tablet Take 1 tablet (500 mg total) by mouth every 12 (twelve) hours.  Marland Kitchen ibuprofen (ADVIL,MOTRIN) 600 MG tablet Take 1 tablet (600 mg total) by mouth every 6 (six) hours as needed for pain.  Marland Kitchen oxyCODONE-acetaminophen (PERCOCET/ROXICET) 5-325 MG per tablet Take 1-2 tablets by mouth every 4 (four) hours as needed for pain.    FAMILY HISTORY:  His has no family status information on file.    SOCIAL HISTORY: He  reports that he has been smoking.  He has never used smokeless tobacco.  REVIEW OF SYSTEMS:   Review of Systems  Constitutional: Positive for malaise/fatigue. Negative for chills and fever.  HENT: Negative.   Eyes: Negative.   Respiratory: Positive for cough and shortness  of breath. Negative for hemoptysis.   Cardiovascular: Positive for chest pain and orthopnea.  Gastrointestinal: Negative.   Genitourinary: Negative.   Musculoskeletal: Negative.   Skin: Negative.   Neurological: Negative.   Endo/Heme/Allergies: Negative.       VITAL SIGNS: BP 119/87   Pulse 95   Temp 99.7 F (37.6 C) (Rectal)   Resp (!) 34   Ht 5\' 8"  (1.727 m)   Wt 165 lb (74.8 kg)   SpO2 100%   BMI 25.09 kg/m    INTAKE / OUTPUT: No intake/output data recorded.  PHYSICAL EXAMINATION: General:  patient appears acutelyio. He is tachyopneic,he has a hacking cough Neuro:  Alert and oriented HEENT: Brutus/AT, poor dentition with missing teeth Cardiovascular:  RRR S1S2 Lungs: Bilateral coarse rales aaat both bases Abdomen:  Soft, BS, non-tender, no gross organomegaly Extr:: s c/c/e  Skin:  Warm and dry  LABS:  BMET Recent Labs  Lab 03/10/18 1438  NA 123*  K 2.9*  CL 89*  CO2 17*  BUN 13  CREATININE 0.72  GLUCOSE 94    Electrolytes Recent Labs  Lab 03/10/18 1438  CALCIUM 8.8*    CBC Recent Labs  Lab 03/10/18 1438  WBC 23.3*  HGB 11.5*  HCT 34.3*  PLT 399    Coag's No results for input(s): APTT, INR in the last 168 hours.  Sepsis Markers Recent Labs  Lab 03/10/18 1624  LATICACIDVEN 2.60*  ABG No results for input(s): PHART, PCO2ART, PO2ART in the last 168 hours.  Liver Enzymes No results for input(s): AST, ALT, ALKPHOS, BILITOT, ALBUMIN in the last 168 hours.  Cardiac Enzymes No results for input(s): TROPONINI, PROBNP in the last 168 hours.  Glucose No results for input(s): GLUCAP in the last 168 hours.  Imaging Dg Chest 2 View  Result Date: 03/10/2018 CLINICAL DATA:  Chest pain and shortness of breath EXAM: CHEST - 2 VIEW COMPARISON:  None. FINDINGS: There is patchy airspace consolidation in the left base and the right middle lobe. Lungs elsewhere clear. Heart size and pulmonary vascularity are normal. No adenopathy. No bone lesions.  IMPRESSION: Infiltrate consistent with pneumonia left lower lobe and right middle lobe. Lungs elsewhere clear. No adenopathy evident. Electronically Signed   By: Lowella Grip III M.D.   On: 03/10/2018 15:36     STUDIES:    CBC    Component Value Date/Time   WBC 23.3 (H) 03/10/2018 1438   RBC 4.54 03/10/2018 1438   HGB 11.5 (L) 03/10/2018 1438   HCT 34.3 (L) 03/10/2018 1438   PLT 399 03/10/2018 1438   MCV 75.6 (L) 03/10/2018 1438   MCH 25.3 (L) 03/10/2018 1438   MCHC 33.5 03/10/2018 1438   RDW 12.9 03/10/2018 1438   LYMPHSABS 1.9 03/10/2018 1438   MONOABS 4.2 (H) 03/10/2018 1438   EOSABS 0.0 03/10/2018 1438   BASOSABS 0.0 03/10/2018 1438     BMP Latest Ref Rng & Units 03/10/2018  Glucose 65 - 99 mg/dL 94  BUN 6 - 20 mg/dL 13  Creatinine 0.61 - 1.24 mg/dL 0.72  Sodium 135 - 145 mmol/L 123(L)  Potassium 3.5 - 5.1 mmol/L 2.9(L)  Chloride 101 - 111 mmol/L 89(L)  CO2 22 - 32 mmol/L 17(L)  Calcium 8.9 - 10.3 mg/dL 8.8(L)      ASSESSMENT / PLAN:  This is a young man who has been sick the past several days.he has bilateral pneumonia and is quite symptomatic. He is quite breathless and has some use of accessory resp. Mm. He has an elevated lactate and elevated WBC withn a left shift. In addition, he has a history of heavy daily alcohol use.  PULMONARY/INFX The patient is being admitted to the ICU because of his wob, hypoxemia. I am concerned that if his resp. status continues to deteriorate he may require intubation in the next 24 hours. He received about 3 liters IV flid thus far asper sepsi protocol. The patient will be tested for flu. We will request sputum C and S and blood cultures. He has been started on empiric therapy for CAP with Zithro and Rocephin. I am awaiting his ABG. If he has fairly profound acidemia he may benefit from some bicarb replacment which could be fueling his tachypnea. Rapid flu test was ordered by ED.  Alcoholism The patient needs to be  monitored for signs of alcohol withdrawal . He is a daily beer drinker.  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 Will monitor his serum Na q 6h for now. I am checking urine osmolarity and spot urine Na+.  Hypokalemia Getting replacement   Micheal Likens MD Pulmonary and [place] Pager: [phone]  03/10/2018, 6:38 PM

## 2018-03-10 NOTE — Plan of Care (Signed)
  Problem: Education: Goal: Knowledge of General Education information will improve Outcome: Progressing   Problem: Health Behavior/Discharge Planning: Goal: Ability to manage health-related needs will improve Outcome: Progressing   Problem: Clinical Measurements: Goal: Ability to maintain clinical measurements within normal limits will improve Outcome: Progressing Goal: Will remain free from infection Outcome: Not Progressing Goal: Diagnostic test results will improve Outcome: Progressing

## 2018-03-10 NOTE — ED Notes (Signed)
Patient remains diaphoretic and with tachypnea. Denies pain or discomfort. Non-productive cough present. Will continue to monitor.

## 2018-03-10 NOTE — ED Notes (Signed)
PAGED ADMITTING PER RN  

## 2018-03-11 ENCOUNTER — Inpatient Hospital Stay (HOSPITAL_COMMUNITY): Payer: Self-pay

## 2018-03-11 LAB — CBC WITH DIFFERENTIAL/PLATELET
BASOS ABS: 0 10*3/uL (ref 0.0–0.1)
Basophils Relative: 0 %
EOS PCT: 0 %
Eosinophils Absolute: 0 10*3/uL (ref 0.0–0.7)
HEMATOCRIT: 29.4 % — AB (ref 39.0–52.0)
HEMOGLOBIN: 9.4 g/dL — AB (ref 13.0–17.0)
LYMPHS PCT: 8 %
Lymphs Abs: 1.8 10*3/uL (ref 0.7–4.0)
MCH: 24.3 pg — ABNORMAL LOW (ref 26.0–34.0)
MCHC: 32 g/dL (ref 30.0–36.0)
MCV: 76 fL — AB (ref 78.0–100.0)
Monocytes Absolute: 2.9 10*3/uL — ABNORMAL HIGH (ref 0.1–1.0)
Monocytes Relative: 13 %
NEUTROS ABS: 17.4 10*3/uL — AB (ref 1.7–7.7)
Neutrophils Relative %: 79 %
Platelets: 408 10*3/uL — ABNORMAL HIGH (ref 150–400)
RBC: 3.87 MIL/uL — ABNORMAL LOW (ref 4.22–5.81)
RDW: 12.9 % (ref 11.5–15.5)
WBC: 22.1 10*3/uL — ABNORMAL HIGH (ref 4.0–10.5)

## 2018-03-11 LAB — BASIC METABOLIC PANEL
Anion gap: 12 (ref 5–15)
Anion gap: 9 (ref 5–15)
Anion gap: 9 (ref 5–15)
BUN: 8 mg/dL (ref 6–20)
BUN: 11 mg/dL (ref 6–20)
BUN: 8 mg/dL (ref 6–20)
CHLORIDE: 101 mmol/L (ref 101–111)
CHLORIDE: 101 mmol/L (ref 101–111)
CO2: 24 mmol/L (ref 22–32)
CO2: 20 mmol/L — AB (ref 22–32)
CO2: 24 mmol/L (ref 22–32)
CREATININE: 0.57 mg/dL — AB (ref 0.61–1.24)
Calcium: 7.6 mg/dL — ABNORMAL LOW (ref 8.9–10.3)
Calcium: 8.1 mg/dL — ABNORMAL LOW (ref 8.9–10.3)
Calcium: 8.2 mg/dL — ABNORMAL LOW (ref 8.9–10.3)
Chloride: 101 mmol/L (ref 101–111)
Creatinine, Ser: 0.61 mg/dL (ref 0.61–1.24)
Creatinine, Ser: 0.59 mg/dL — ABNORMAL LOW (ref 0.61–1.24)
GFR calc Af Amer: >60 mL/min (ref >60)
GFR calc Af Amer: >60 mL/min (ref >60)
GFR calc non Af Amer: >60 mL/min (ref >60)
GFR calc non Af Amer: >60 mL/min (ref >60)
GLUCOSE: 138 mg/dL — AB (ref 65–99)
GLUCOSE: 141 mg/dL — AB (ref 65–99)
GLUCOSE: 144 mg/dL — AB (ref 65–99)
Potassium: 3.5 mmol/L (ref 3.5–5.1)
Potassium: 3.7 mmol/L (ref 3.5–5.1)
Potassium: 3.7 mmol/L (ref 3.5–5.1)
SODIUM: 134 mmol/L — AB (ref 135–145)
Sodium: 130 mmol/L — ABNORMAL LOW (ref 135–145)
Sodium: 137 mmol/L (ref 135–145)

## 2018-03-11 LAB — OSMOLALITY: OSMOLALITY: 278 mosm/kg (ref 275–295)

## 2018-03-11 LAB — EXPECTORATED SPUTUM ASSESSMENT W GRAM STAIN, RFLX TO RESP C

## 2018-03-11 LAB — MRSA PCR SCREENING: MRSA by PCR: NEGATIVE

## 2018-03-11 LAB — EXPECTORATED SPUTUM ASSESSMENT W REFEX TO RESP CULTURE

## 2018-03-11 MED ORDER — DEXTROSE 5 % IV SOLN
INTRAVENOUS | Status: DC
  Administered 2018-03-11: 08:00:00 via INTRAVENOUS

## 2018-03-11 MED ORDER — KETOROLAC TROMETHAMINE 30 MG/ML IJ SOLN
30.0000 mg | Freq: Four times a day (QID) | INTRAMUSCULAR | Status: DC | PRN
  Administered 2018-03-11: 30 mg via INTRAVENOUS
  Filled 2018-03-11: qty 1

## 2018-03-11 MED ORDER — SODIUM CHLORIDE 0.9 % IV SOLN
2.0000 g | INTRAVENOUS | Status: DC
  Administered 2018-03-11 – 2018-03-12 (×2): 2 g via INTRAVENOUS
  Filled 2018-03-11 (×3): qty 20

## 2018-03-11 MED ORDER — IPRATROPIUM-ALBUTEROL 0.5-2.5 (3) MG/3ML IN SOLN
3.0000 mL | Freq: Three times a day (TID) | RESPIRATORY_TRACT | Status: DC
  Administered 2018-03-12 (×2): 3 mL via RESPIRATORY_TRACT
  Filled 2018-03-11 (×3): qty 3

## 2018-03-11 MED ORDER — SODIUM CHLORIDE 0.9 % IV SOLN
500.0000 mg | INTRAVENOUS | Status: AC
  Administered 2018-03-11 – 2018-03-14 (×4): 500 mg via INTRAVENOUS
  Filled 2018-03-11 (×4): qty 500

## 2018-03-11 MED ORDER — ACETAMINOPHEN 325 MG PO TABS
650.0000 mg | ORAL_TABLET | Freq: Four times a day (QID) | ORAL | Status: DC | PRN
Start: 2018-03-11 — End: 2018-03-15
  Administered 2018-03-12: 650 mg via ORAL
  Filled 2018-03-11: qty 2

## 2018-03-11 NOTE — Progress Notes (Signed)
PULMONARY  / CRITICAL CARE MEDICINE  Name: Christopher Velez MRN: [id number] DOB: Jan 05, 1981    LOS: 1  REFERRING MD :  ED. Dr. Ellender Hose  CHIEF COMPLAINT:  Shortness of breath , cough  BRIEF PATIENT DESCRIPTION: 37 yo male with no PMH who presented with several days of SOB and hypoxia and found to have influenza A and bilateral pneumonia.   LINES / TUBES: PIV LEU and RUE   CULTURES: Sputum cx - pending  Respiratory cx - moderate GNRs, few GPRs and GPCs in pairs, report pending  Bcx sent and pending   ANTIBIOTICS: Azithromycin and CTX 4/13 >  Tamiflu 4/13-4/18  SIGNIFICANT EVENTS: None  INTERVAL HISTORY: No acute events overnight. Doing well. Denies chest pain and shortness of breath. Complaining of ongoing dry cough.   VITAL SIGNS: Temp:  [97.9 F (36.6 C)-99.7 F (37.6 C)] 98 F (36.7 C) (04/14 0442) Pulse Rate:  [68-114] 99 (04/14 0600) Resp:  [18-42] 27 (04/14 0500) BP: (104-128)/(61-87) 128/75 (04/14 0600) SpO2:  [90 %-100 %] 100 % (04/14 0600) FiO2 (%):  [50 %] 50 % (04/13 2005) Weight:  [165 lb (74.8 kg)-165 lb 12.6 oz (75.2 kg)] 165 lb 12.6 oz (75.2 kg) (04/13 2107) HEMODYNAMICS:   VENTILATOR SETTINGS: FiO2 (%):  [50 %] 50 % INTAKE / OUTPUT: Intake/Output      04/13 0701 - 04/14 0700   I.V. (mL/kg) 1577.5 (21)   IV Piggyback 3800   Total Intake(mL/kg) 5377.5 (71.5)   Urine (mL/kg/hr) 2650   Total Output 2650   Net +2727.5         PHYSICAL EXAMINATION: General:  Chronically-ill appearing male who appears older than stated age, comfortable in bed  Neuro:  Alert and oriented, able to move all 4 extremities spontaneously, no focal deficits noted  HEENT:  NCAT, MMM, OP clear  Cardiovascular:  RRR, nl S1/S2, no mrg  Lungs:  Decreased breath sounds at bilateral bases, otherwise clear to auscultation, no increased work of breathing while on 5L [place]  Abdomen:  Soft, NTND, normoactive bowel sounds  Musculoskeletal:  No joint pain  Skin:  No rashes noted     LABS: Cbc Recent Labs  Lab 03/10/18 1438  WBC 23.3*  HGB 11.5*  HCT 34.3*  PLT 399    Chemistry  Recent Labs  Lab 03/10/18 1438 03/10/18 2035 03/10/18 2354  NA 123* 128* 130*  K 2.9* 3.6 3.7  CL 89* 98* 101  CO2 17* 17* 20*  BUN 13 12 11   CREATININE 0.72 0.73 0.57*  CALCIUM 8.8* 7.7* 7.6*  GLUCOSE 94 135* 144*    Liver fxn Recent Labs  Lab 03/10/18 2035  AST 79*  ALT 58  ALKPHOS 129*  BILITOT 3.6*  PROT 6.1*  ALBUMIN 1.9*   coags Recent Labs  Lab 03/10/18 2035  APTT 35  INR 1.10   Sepsis markers Recent Labs  Lab 03/10/18 1624 03/10/18 1852 03/10/18 2035 03/10/18 2041  LATICACIDVEN 2.60* 2.11*  --  1.8  PROCALCITON  --   --  1.44  --    Cardiac markers Recent Labs  Lab 03/10/18 2035  TROPONINI <0.03   BNP No results for input(s): PROBNP in the last 168 hours. ABG Recent Labs  Lab 03/10/18 1844 03/10/18 1850  PHART 7.345*  --   PCO2ART 35.4  --   PO2ART 173.0*  --   HCO3 19.2* 21.2  TCO2 20* 23    CBG trend Recent Labs  Lab 03/10/18 2201  GLUCAP 159*  IMAGING:  CXR with bibasilar pulmonary infiltrates unchanged from yesterday   ECG: none obtained today   DIAGNOSES: Active Problems:   CAP (community acquired pneumonia)   ASSESSMENT / PLAN:  PULMONARY A:  Influenza A  Bilateral PNA - improving, now on 5L [place], sputum cx negative,   PLAN:   - Continue CAP coverage with azithro and Rocephin  - Wean O2 as tolerated  - Droplet precautions   CARDIOVASCULAR: No active issues  - VS stable  - MAP goal> 65  RENAL A:  Hyponatremia - thought to be 2/2 SIADH but responding to fluids which argues against this. Likely 2/2 poor PO intake in setting of alcohol abuse. Urine studies ordered though unreliable as he received 3L IVF prior to collection. Na 137 this AM, overcorrected   PLAN:   - D/C NS and LR - Start D5W in the setting of overcorrection  - Follow up BMP at noon - Monitor electrolytes and replete as  needed    GASTROINTESTINAL A:  GI ppx  Nutrition  PLAN:   - NPO > regular diet   HEMATOLOGIC - no active issues  - VTE ppx on SCDs   INFECTIOUS A:  Influenza A and bilateral PNA   PLAN:   - CAP coverage as above   ENDOCRINE - no active issues   NEUROLOGIC A: Alcohol use disorder- daily beer drinker   PLAN:   - CIWA protocol    Welford Roche, MD  Internal Medicine PGY-1  P [phone] 03/11/2018, 6:43 AM

## 2018-03-12 ENCOUNTER — Inpatient Hospital Stay (HOSPITAL_COMMUNITY): Payer: Self-pay

## 2018-03-12 DIAGNOSIS — J189 Pneumonia, unspecified organism: Secondary | ICD-10-CM

## 2018-03-12 DIAGNOSIS — F10930 Alcohol use, unspecified with withdrawal, uncomplicated: Secondary | ICD-10-CM

## 2018-03-12 DIAGNOSIS — F10230 Alcohol dependence with withdrawal, uncomplicated: Secondary | ICD-10-CM

## 2018-03-12 DIAGNOSIS — J9601 Acute respiratory failure with hypoxia: Secondary | ICD-10-CM

## 2018-03-12 LAB — BASIC METABOLIC PANEL
Anion gap: 10 (ref 5–15)
BUN: 12 mg/dL (ref 6–20)
CALCIUM: 8 mg/dL — AB (ref 8.9–10.3)
CO2: 23 mmol/L (ref 22–32)
CREATININE: 0.56 mg/dL — AB (ref 0.61–1.24)
Chloride: 99 mmol/L — ABNORMAL LOW (ref 101–111)
GFR calc Af Amer: >60 mL/min (ref >60)
Glucose, Bld: 101 mg/dL — ABNORMAL HIGH (ref 65–99)
Potassium: 3.1 mmol/L — ABNORMAL LOW (ref 3.5–5.1)
Sodium: 132 mmol/L — ABNORMAL LOW (ref 135–145)

## 2018-03-12 LAB — CBC WITH DIFFERENTIAL/PLATELET
BASOS PCT: 1 %
Basophils Absolute: 0.2 10*3/uL — ABNORMAL HIGH (ref 0.0–0.1)
EOS ABS: 0 10*3/uL (ref 0.0–0.7)
Eosinophils Relative: 0 %
HCT: 28.8 % — ABNORMAL LOW (ref 39.0–52.0)
Hemoglobin: 9.3 g/dL — ABNORMAL LOW (ref 13.0–17.0)
Lymphocytes Relative: 22 %
Lymphs Abs: 3.8 10*3/uL (ref 0.7–4.0)
MCH: 25 pg — AB (ref 26.0–34.0)
MCHC: 32.3 g/dL (ref 30.0–36.0)
MCV: 77.4 fL — AB (ref 78.0–100.0)
MONO ABS: 2.8 10*3/uL — AB (ref 0.1–1.0)
Monocytes Relative: 16 %
NEUTROS ABS: 10.4 10*3/uL — AB (ref 1.7–7.7)
Neutrophils Relative %: 61 %
PLATELETS: 450 10*3/uL — AB (ref 150–400)
RBC: 3.72 MIL/uL — ABNORMAL LOW (ref 4.22–5.81)
RDW: 13.4 % (ref 11.5–15.5)
WBC: 17.2 10*3/uL — ABNORMAL HIGH (ref 4.0–10.5)

## 2018-03-12 MED ORDER — ALBUTEROL SULFATE (2.5 MG/3ML) 0.083% IN NEBU: 2.5000 mg | INHALATION_SOLUTION | RESPIRATORY_TRACT | Status: DC | PRN

## 2018-03-12 MED ORDER — POTASSIUM CHLORIDE CRYS ER 20 MEQ PO TBCR
40.0000 meq | EXTENDED_RELEASE_TABLET | Freq: Two times a day (BID) | ORAL | Status: AC
  Administered 2018-03-12 (×2): 40 meq via ORAL
  Filled 2018-03-12 (×3): qty 2

## 2018-03-12 NOTE — Evaluation (Signed)
Physical Therapy Evaluation Patient Details Name: Christopher Velez MRN: [id number] DOB: 12/29/1980 Today's Date: 03/12/2018   History of Present Illness  37 yo male with no PMH who presented with several days of SOB and hypoxia and found to have influenza A and bilateral pneumonia.  Clinical Impression   Patient evaluated by Physical Therapy with no further acute PT needs identified. All education has been completed and the patient has no further questions.  See below for any follow-up Physical Therapy or equipment needs. PT is signing off. Thank you for this referral.     Follow Up Recommendations No PT follow up    Equipment Recommendations  None recommended by PT    Recommendations for Other Services       Precautions / Restrictions Precautions Precautions: Other (comment) Precaution Comments: Droplet      Mobility  Bed Mobility                  Transfers Overall transfer level: Independent                  Ambulation/Gait Ambulation/Gait assistance: Independent Ambulation Distance (Feet): 400 Feet Assistive device: None Gait Pattern/deviations: WFL(Within Functional Limits) Gait velocity: WFL   General Gait Details: No balance deficits or difficulty walking; Walked on Room Air and O2 sats remained greater than or equal to 92%  Stairs            Wheelchair Mobility    Modified Rankin (Stroke Patients Only)       Balance Overall balance assessment: No apparent balance deficits (not formally assessed)                                           Pertinent Vitals/Pain Pain Assessment: No/denies pain    Home Living Family/patient expects to be discharged to:: Private residence Living Arrangements: Other relatives Available Help at Discharge: Family;Available PRN/intermittently Type of Home: House Home Access: Level entry     Home Layout: One level        Prior Function Level of Independence: Independent                Hand Dominance        Extremity/Trunk Assessment   Upper Extremity Assessment Upper Extremity Assessment: Overall WFL for tasks assessed    Lower Extremity Assessment Lower Extremity Assessment: Overall WFL for tasks assessed    Cervical / Trunk Assessment Cervical / Trunk Assessment: Normal  Communication   Communication: No difficulties(speaks Cambodian as well)  Cognition Arousal/Alertness: Awake/alert Behavior During Therapy: WFL for tasks assessed/performed Overall Cognitive Status: Within Functional Limits for tasks assessed                                        General Comments      Exercises     Assessment/Plan    PT Assessment Patent does not need any further PT services  PT Problem List         PT Treatment Interventions      PT Goals (Current goals can be found in the Care Plan section)  Acute Rehab PT Goals Patient Stated Goal: hopes to be home soon PT Goal Formulation: All assessment and education complete, DC therapy    Frequency     Barriers to discharge  Co-evaluation               AM-PAC PT "6 Clicks" Daily Activity  Outcome Measure Difficulty turning over in bed (including adjusting bedclothes, sheets and blankets)?: None Difficulty moving from lying on back to sitting on the side of the bed? : None Difficulty sitting down on and standing up from a chair with arms (e.g., wheelchair, bedside commode, etc,.)?: None Help needed moving to and from a bed to chair (including a wheelchair)?: None Help needed walking in hospital room?: None Help needed climbing 3-5 steps with a railing? : None 6 Click Score: 24    End of Session   Activity Tolerance: Patient tolerated treatment well Patient left: in chair;with call bell/phone within reach;with chair alarm set Nurse Communication: Mobility status PT Visit Diagnosis: Muscle weakness (generalized) (M62.81)    Time: 2263-3354 PT Time Calculation  (min) (ACUTE ONLY): 14 min   Charges:   PT Evaluation $PT Eval Low Complexity: 1 Low     PT G Codes:        Roney Marion, PT  Acute Rehabilitation Services Pager [phone] Office [place] 03/12/2018, 2:46 PM

## 2018-03-12 NOTE — Progress Notes (Addendum)
PULMONARY  / CRITICAL CARE MEDICINE  Name: Christopher Velez MRN: [id number] DOB: [birth year]    LOS: 2  REFERRING MD :  ED. Dr. Ellender Hose  CHIEF COMPLAINT:  Shortness of breath , cough  BRIEF PATIENT DESCRIPTION: 37 yo male with no PMH who presented with several days of SOB and hypoxia and found to have influenza A and bilateral pneumonia.   LINES / TUBES: 4/13 PIV LUE and RUE   CULTURES: Sputum cx - pending  Respiratory cx - moderate GNRs, few GPRs and GPCs in pairs, report pending  Bcx negative at 24h    ANTIBIOTICS: Azithromycin and CTX 4/13 >  Tamiflu 4/13-4/18  SIGNIFICANT EVENTS:  None   INTERVAL HISTORY: No acute issues overnight. Doing well this morning. Only complaint is dry cough, otherwise feeling better.   VITAL SIGNS: Temp:  [97.9 F (36.6 C)-98.3 F (36.8 C)] 98.2 F (36.8 C) (04/15 0400) Pulse Rate:  [40-114] 83 (04/15 0600) Resp:  [21-47] 22 (04/15 0600) BP: (98-115)/(65-85) 115/80 (04/15 0600) SpO2:  [94 %-100 %] 98 % (04/15 0600) HEMODYNAMICS:   VENTILATOR SETTINGS:   INTAKE / OUTPUT: Intake/Output      04/14 0701 - 04/15 0700 04/15 0701 - 04/16 0700   P.O. 1960    I.V. (mL/kg) 146.3 (1.9)    IV Piggyback 350    Total Intake(mL/kg) 2456.3 (32.7)    Urine (mL/kg/hr) 750 (0.4)    Stool 0    Total Output 750    Net +1706.3         Urine Occurrence 1 x    Stool Occurrence 1 x      PHYSICAL EXAMINATION: General: appears comfortable in bed  Neuro:  A;ert and oriented, able to move all 4 extremities spontaneously, no focal deficits noted  HEENT:  NCAT, MMM, OP clear, no scleral icterus  Cardiovascular:  RRR, nl S1/S2, no mrg  Lungs:  Mild bibasilar crackles, no increased work of breathing while on 3L HFNC  Abdomen:  Soft, NTND, normoactive bowel sounds  Musculoskeletal: no joint pain or deformities   Skin: no rashes    LABS: Cbc Recent Labs  Lab 03/10/18 1438 03/11/18 1218 03/12/18 0443  WBC 23.3* 22.1* 17.2*  HGB 11.5* 9.4* 9.3*  HCT  34.3* 29.4* 28.8*  PLT 399 408* 450*    Chemistry  Recent Labs  Lab 03/11/18 0602 03/11/18 1218 03/12/18 0443  NA 137 134* 132*  K 3.7 3.5 3.1*  CL 101 101 99*  CO2 24 24 23   BUN 8 8 12   CREATININE 0.61 0.59* 0.56*  CALCIUM 8.2* 8.1* 8.0*  GLUCOSE 141* 138* 101*    Liver fxn Recent Labs  Lab 03/10/18 2035  AST 79*  ALT 58  ALKPHOS 129*  BILITOT 3.6*  PROT 6.1*  ALBUMIN 1.9*   coags Recent Labs  Lab 03/10/18 2035  APTT 35  INR 1.10   Sepsis markers Recent Labs  Lab 03/10/18 1624 03/10/18 1852 03/10/18 2035 03/10/18 2041  LATICACIDVEN 2.60* 2.11*  --  1.8  PROCALCITON  --   --  1.44  --    Cardiac markers Recent Labs  Lab 03/10/18 2035  TROPONINI <0.03   BNP No results for input(s): PROBNP in the last 168 hours. ABG Recent Labs  Lab 03/10/18 1844 03/10/18 1850  PHART 7.345*  --   PCO2ART 35.4  --   PO2ART 173.0*  --   HCO3 19.2* 21.2  TCO2 20* 23    CBG trend Recent Labs  Lab 03/10/18 2201  GLUCAP 159*    IMAGING: no new imagine to review   ECG: none   DIAGNOSES: Active Problems:   CAP (community acquired pneumonia)   ASSESSMENT / PLAN:  PULMONARY A:  Influenza A  Bilateral PNA - improving, now on 3L HFNC. Bcx neg at 24h. Sputum cx pending.  PLAN:   - Continue CAP coverage with azithro and Rocephin  - Scheduled duonebs TID  - Wean O2 as tolerated, if able to wean to [place] will transfer out of ICU later today  - PT evaluation  - Droplet precautions  CARDIOVASCULAR: No active issues, currently hemodynamically stable  - MAP goal> 65  RENAL A:  Hyponatremia - likely 2/2 beer potomania. Urine studies unreliable as obtained after fluid resuscitation. Na 132 this AM.  Hypokalemia - K 3.1, repleting  PLAN:   - Monitor electrolytes and replete as needed    GASTROINTESTINAL A:  GI ppx  Nutrition Elevated bilirubin at 3.6 on 4/13, Alk Phos 129 and AST elevated at 79 PLAN:   - Regular diet  - Follow up abdomen  ultrasound   HEMATOLOGIC - no active issues  - VTE ppx on SCDs   INFECTIOUS A:  Influenza A and bilateral PNA  PLAN:   - CAP coverage as above   ENDOCRINE - no active issues   NEUROLOGIC A: Alcohol use disorder- daily beer drinker, required Ativan x1 yesterday  PLAN:   - CIWA protocol  - MVI + folic acid + thiamine    Welford Roche, MD  Internal Medicine PGY-1  P [phone] 03/12/2018, 7:17 AM

## 2018-03-12 NOTE — Progress Notes (Signed)
Attempted report x1. Call back number given.

## 2018-03-12 NOTE — Progress Notes (Signed)
Patient transferred to [phone]. Patient ambulated from wheelchair to bed without assistance. Heart rate and all vital signs stable upon arrival and during transfer. RN notified patient needs to be hooked up to tele monitor. Belongings sent with patient including cell phone, charger and clothing.

## 2018-03-12 NOTE — Progress Notes (Signed)
Abdominal US at bedside.

## 2018-03-13 DIAGNOSIS — J111 Influenza due to unidentified influenza virus with other respiratory manifestations: Secondary | ICD-10-CM

## 2018-03-13 LAB — CBC WITH DIFFERENTIAL/PLATELET
Basophils Absolute: 0 10*3/uL (ref 0.0–0.1)
Basophils Relative: 0 %
EOS PCT: 1 %
Eosinophils Absolute: 0.1 10*3/uL (ref 0.0–0.7)
HEMATOCRIT: 29.8 % — AB (ref 39.0–52.0)
HEMOGLOBIN: 9.5 g/dL — AB (ref 13.0–17.0)
LYMPHS PCT: 26 %
Lymphs Abs: 3.7 10*3/uL (ref 0.7–4.0)
MCH: 24.9 pg — ABNORMAL LOW (ref 26.0–34.0)
MCHC: 31.9 g/dL (ref 30.0–36.0)
MCV: 78 fL (ref 78.0–100.0)
MONOS PCT: 19 %
Monocytes Absolute: 2.7 10*3/uL — ABNORMAL HIGH (ref 0.1–1.0)
NEUTROS ABS: 7.6 10*3/uL (ref 1.7–7.7)
Neutrophils Relative %: 54 %
Platelets: 535 10*3/uL — ABNORMAL HIGH (ref 150–400)
RBC: 3.82 MIL/uL — AB (ref 4.22–5.81)
RDW: 13.1 % (ref 11.5–15.5)
WBC: 14.1 10*3/uL — AB (ref 4.0–10.5)

## 2018-03-13 LAB — BASIC METABOLIC PANEL
Anion gap: 10 (ref 5–15)
BUN: 7 mg/dL (ref 6–20)
CHLORIDE: 103 mmol/L (ref 101–111)
CO2: 26 mmol/L (ref 22–32)
CREATININE: 0.56 mg/dL — AB (ref 0.61–1.24)
Calcium: 8.4 mg/dL — ABNORMAL LOW (ref 8.9–10.3)
GFR calc Af Amer: >60 mL/min (ref >60)
GFR calc non Af Amer: >60 mL/min (ref >60)
Glucose, Bld: 92 mg/dL (ref 65–99)
POTASSIUM: 3.5 mmol/L (ref 3.5–5.1)
Sodium: 139 mmol/L (ref 135–145)

## 2018-03-13 LAB — CULTURE, RESPIRATORY W GRAM STAIN: Culture: NORMAL

## 2018-03-13 MED ORDER — SODIUM CHLORIDE 0.9 % IV SOLN
2.0000 g | Freq: Every day | INTRAVENOUS | Status: DC
  Administered 2018-03-13: 2 g via INTRAVENOUS
  Filled 2018-03-13 (×2): qty 20

## 2018-03-13 MED ORDER — ENOXAPARIN SODIUM 40 MG/0.4ML ~~LOC~~ SOLN
40.0000 mg | SUBCUTANEOUS | Status: DC
  Administered 2018-03-13 – 2018-03-14 (×2): 40 mg via SUBCUTANEOUS
  Filled 2018-03-13 (×2): qty 0.4

## 2018-03-13 NOTE — Progress Notes (Signed)
PROGRESS NOTE    Christopher Velez  ATF:[id number] DOB: 1981-11-18 DOA: 03/10/2018 PCP: Patient, No Pcp Per   Brief Narrative: Patient is a 37 year old male with past medical history of chronic alcohol abuse, nicotine abuse who presented to the emergency department with cough and shortness of breath. He was found ti be in acute respiratory failure secondary to pneumonia and influenza.  He was admitted under pulmonary service and has been  transferred to hospitalist service today.  Assessment & Plan:   Principal Problem:   Acute respiratory failure with hypoxia (HCC) Active Problems:   CAP (community acquired pneumonia)   Alcohol withdrawal syndrome without complication (HCC)   Influenza  Acute respiratory failure with hypoxia: Secondary to pneumonia and flu .Respiratory status has improved today.  Currently he is saturating fine on room air.  Still looks slightly tachypneic.  Has cough. Patient was admitted to ICU initially because of worsening respiratory status and hypoxemia.  Sepsis with Community-acquired pneumonia: Started on azithromycin and ceftriaxone which we will continue.  Blood cultures negative so far.  Sputum culture showed normal respiratory flora. Still has mild leukocytosis.  Influenza A: Continue Tamiflu to complete the course.  Chronic alcoholism: Says he drinks 40 ounces of beer daily.  Has been doing that for several years.  Counseled for stopping alcohol consumption.  Not in withdrawal. Continue thiamine and folic acid.  Smoker: Counseled for smoking cessation  Hypokalemia: Supplemented.  Elevated liver enzymes:Mild.Alcoholic pattern with AST more than ALT. US abdomen showed approximately 3.7 cm geographic hyperechoic lesion withinsubcapsular aspect the right lobe of the liver favored to represent either a hepatic hemangioma or focal fatty infiltration .  Follow-up ultrasound as an outpatient.   DVT prophylaxis: Lovenox Code Status: Full Family Communication:  None present at the bedside  disposition Plan: After stabilization of respiratory status, may be in 1-2 days   Consultants: PCCM  Procedures:None  Antimicrobials: Ceftriaxone and azithromycin Day 4.  Subjective: Patient seen and examined the bedside this morning.  Currently saturating fine on room air.  But still looks short of breath and slightly tachypneic.  Has cough.  Objective: Vitals:   03/12/18 1903 03/12/18 2136 03/13/18 0606 03/13/18 1330  BP: 102/81 110/73 (!) 89/70 99/70  Pulse: 94 90 (!) 102 100  Resp: 20 18 18 18   Temp: 99.3 F (37.4 C) 98 F (36.7 C) 98.1 F (36.7 C) 99.5 F (37.5 C)  TempSrc: Oral Oral Oral Oral  SpO2: 98% 98% 93% 96%  Weight:      Height:        Intake/Output Summary (Last 24 hours) at 03/13/2018 1456 Last data filed at 03/13/2018 0900 Gross per 24 hour  Intake 322 ml  Output -  Net 322 ml   Filed Weights   03/10/18 1441 03/10/18 2107 03/12/18 1900  Weight: 74.8 kg (165 lb) 75.2 kg (165 lb 12.6 oz) 77 kg (169 lb 12.1 oz)    Examination:  General exam: Appears calm and comfortable ,Not in distress,average built HEENT:PERRL,Oral mucosa moist, Ear/Nose normal on gross exam Respiratory system: Bilateral decreased air entry, scattered rhonchi and basilar crackles Cardiovascular system: S1 & S2 heard, RRR. No JVD, murmurs, rubs, gallops or clicks. No pedal edema. Gastrointestinal system: Abdomen is nondistended, soft and nontender. No organomegaly or masses felt. Normal bowel sounds heard. Central nervous system: Alert and oriented. No focal neurological deficits. Extremities: No edema, no clubbing ,no cyanosis, distal peripheral pulses palpable. Skin: No rashes, lesions or ulcers,no icterus ,no pallor MSK: Normal muscle bulk,tone ,power  Psychiatry: Judgement and insight appear normal. Mood & affect appropriate.     Data Reviewed: I have personally reviewed following labs and imaging studies  CBC: Recent Labs  Lab 03/10/18 1438  03/11/18 1218 03/12/18 0443 03/13/18 0353  WBC 23.3* 22.1* 17.2* 14.1*  NEUTROABS 17.2* 17.4* 10.4* 7.6  HGB 11.5* 9.4* 9.3* 9.5*  HCT 34.3* 29.4* 28.8* 29.8*  MCV 75.6* 76.0* 77.4* 78.0  PLT 399 408* 450* 924*   Basic Metabolic Panel: Recent Labs  Lab 03/10/18 2354 03/11/18 0602 03/11/18 1218 03/12/18 0443 03/13/18 0353  NA 130* 137 134* 132* 139  K 3.7 3.7 3.5 3.1* 3.5  CL 101 101 101 99* 103  CO2 20* 24 24 23 26   GLUCOSE 144* 141* 138* 101* 92  BUN 11 8 8 12 7   CREATININE 0.57* 0.61 0.59* 0.56* 0.56*  CALCIUM 7.6* 8.2* 8.1* 8.0* 8.4*   GFR: Estimated Creatinine Clearance: 122.3 mL/min (A) (by C-G formula based on SCr of 0.56 mg/dL (L)). Liver Function Tests: Recent Labs  Lab 03/10/18 2035  AST 79*  ALT 58  ALKPHOS 129*  BILITOT 3.6*  PROT 6.1*  ALBUMIN 1.9*   No results for input(s): LIPASE, AMYLASE in the last 168 hours. No results for input(s): AMMONIA in the last 168 hours. Coagulation Profile: Recent Labs  Lab 03/10/18 2035  INR 1.10   Cardiac Enzymes: Recent Labs  Lab 03/10/18 2035  TROPONINI <0.03   BNP (last 3 results) No results for input(s): PROBNP in the last 8760 hours. HbA1C: No results for input(s): HGBA1C in the last 72 hours. CBG: Recent Labs  Lab 03/10/18 2201  GLUCAP 159*   Lipid Profile: No results for input(s): CHOL, HDL, LDLCALC, TRIG, CHOLHDL, LDLDIRECT in the last 72 hours. Thyroid Function Tests: No results for input(s): TSH, T4TOTAL, FREET4, T3FREE, THYROIDAB in the last 72 hours. Anemia Panel: No results for input(s): VITAMINB12, FOLATE, FERRITIN, TIBC, IRON, RETICCTPCT in the last 72 hours. Sepsis Labs: Recent Labs  Lab 03/10/18 1624 03/10/18 1852 03/10/18 2035 03/10/18 2041  PROCALCITON  --   --  1.44  --   LATICACIDVEN 2.60* 2.11*  --  1.8    Recent Results (from the past 240 hour(s))  Blood culture (routine x 2)     Status: None (Preliminary result)   Collection Time: 03/10/18  4:23 PM  Result Value  Ref Range Status   Specimen Description BLOOD LEFT UPPER ARM  Final   Special Requests   Final    BOTTLES DRAWN AEROBIC AND ANAEROBIC Blood Culture adequate volume   Culture   Final    NO GROWTH 3 DAYS Performed at [facility], [place] [address] [place], [place] [address]    Report Status PENDING  Incomplete  Blood culture (routine x 2)     Status: None (Preliminary result)   Collection Time: 03/10/18  4:49 PM  Result Value Ref Range Status   Specimen Description BLOOD LEFT IV  Final   Special Requests   Final    BOTTLES DRAWN AEROBIC AND ANAEROBIC Blood Culture adequate volume   Culture   Final    NO GROWTH 3 DAYS Performed at [facility], [place] [address] [place], [place] [address]    Report Status PENDING  Incomplete  MRSA PCR Screening     Status: None   Collection Time: 03/10/18  9:56 PM  Result Value Ref Range Status   MRSA by PCR NEGATIVE NEGATIVE Final    Comment:  The GeneXpert MRSA Assay (FDA approved for NASAL specimens only), is one component of a comprehensive MRSA colonization surveillance program. It is not intended to diagnose MRSA infection nor to guide or monitor treatment for MRSA infections. Performed at [facility], [place] [address] [place], [place] [address]   Culture, expectorated sputum-assessment     Status: None   Collection Time: 03/11/18  2:03 AM  Result Value Ref Range Status   Specimen Description SPUTUM  Final   Special Requests NONE  Final   Sputum evaluation   Final    THIS SPECIMEN IS ACCEPTABLE FOR SPUTUM CULTURE Performed at [facility], [address], [place], [place] [address]    Report Status 03/11/2018 FINAL  Final  Culture, respiratory (NON-Expectorated)     Status: None   Collection Time: 03/11/18  2:03 AM  Result Value Ref Range Status   Specimen Description SPUTUM  Final   Special Requests NONE Reflexed from [id number]  Final   Gram Stain   Final    ABUNDANT WBC PRESENT,BOTH PMN AND  MONONUCLEAR FEW SQUAMOUS EPITHELIAL CELLS PRESENT MODERATE GRAM NEGATIVE RODS FEW GRAM POSITIVE RODS FEW GRAM POSITIVE COCCI IN PAIRS    Culture   Final    FEW Consistent with normal respiratory flora. Performed at [facility], [place] [address] [place], [place] [address]    Report Status 03/13/2018 FINAL  Final         Radiology Studies: US Abdomen Limited  Result Date: 03/12/2018 CLINICAL DATA:  Hyperbilirubinemia. Sepsis. History of alcohol abuse. EXAM: ULTRASOUND ABDOMEN LIMITED RIGHT UPPER QUADRANT COMPARISON:  None. FINDINGS: Gallbladder: The gallbladder is underdistended and thus suboptimally evaluated. Suspected minimal amount of gallbladder wall thickening measuring 4 mm in diameter, potentially accentuated due to underdistention. No pericholecystic fluid. No echogenic stones or biliary sludge. Negative sonographic Murphy's sign. Common bile duct: Diameter: Normal in size measuring 3.6 mm in diameter Liver: Note is made of a approximately 3.7 x 1.9 x 2.1 cm geographic hyperechoic lesion within the subcapsular aspect of the right lobe of the liver (images 46 through 49) with associated increased through transmission favored to represent either hepatic hemangioma or focal fatty infiltration (favored). No additional discrete hepatic lesions. No intrahepatic biliary ductal dilatation. Portal vein is patent on color Doppler imaging with normal direction of blood flow towards the liver. IMPRESSION: 1. Suboptimal evaluation of the gallbladder secondary to underdistention. If biliary dyskinesia is of clinical concern, further evaluation with nuclear medicine HIDA scan with CCK augmentation could be performed as indicated. 2. Approximately 3.7 cm geographic hyperechoic lesion within subcapsular aspect the right lobe of the liver favored to represent either a hepatic hemangioma or focal fatty infiltration (favored). Electronically Signed   By: Sandi Mariscal M.D.   On: 03/12/2018 07:27         Scheduled Meds: . folic acid  1 mg Oral Daily  . guaiFENesin  600 mg Oral BID  . multivitamin with minerals  1 tablet Oral Daily  . oseltamivir  75 mg Oral BID  . thiamine  100 mg Oral Daily   Continuous Infusions: . azithromycin 500 mg (03/12/18 1745)  . cefTRIAXone (ROCEPHIN)  IV Stopped (03/12/18 1730)     LOS: 3 days    Time spent: More than 50% of that time was spent in counseling and/or coordination of care.      Shelly Coss, MD [facility] Pager [phone]  If 7PM-7AM, please contact night-coverage [email or web address] Password [facility] 03/13/2018, 2:56 PM

## 2018-03-14 ENCOUNTER — Inpatient Hospital Stay (HOSPITAL_COMMUNITY): Payer: Self-pay

## 2018-03-14 DIAGNOSIS — E876 Hypokalemia: Secondary | ICD-10-CM

## 2018-03-14 LAB — CBC WITH DIFFERENTIAL/PLATELET
BASOS ABS: 0 10*3/uL (ref 0.0–0.1)
BLASTS: 0 %
Band Neutrophils: 10 %
Basophils Relative: 0 %
Eosinophils Absolute: 0.1 10*3/uL (ref 0.0–0.7)
Eosinophils Relative: 1 %
HEMATOCRIT: 31.9 % — AB (ref 39.0–52.0)
HEMOGLOBIN: 10 g/dL — AB (ref 13.0–17.0)
Lymphocytes Relative: 35 %
Lymphs Abs: 4.8 10*3/uL — ABNORMAL HIGH (ref 0.7–4.0)
MCH: 25 pg — ABNORMAL LOW (ref 26.0–34.0)
MCHC: 31.3 g/dL (ref 30.0–36.0)
MCV: 79.8 fL (ref 78.0–100.0)
METAMYELOCYTES PCT: 2 %
Monocytes Absolute: 1.6 10*3/uL — ABNORMAL HIGH (ref 0.1–1.0)
Monocytes Relative: 12 %
Myelocytes: 1 %
NRBC: 2 /100{WBCs} — AB
Neutro Abs: 7.1 10*3/uL (ref 1.7–7.7)
Neutrophils Relative %: 39 %
Other: 0 %
Platelets: 556 10*3/uL — ABNORMAL HIGH (ref 150–400)
Promyelocytes Relative: 0 %
RBC: 4 MIL/uL — AB (ref 4.22–5.81)
RDW: 13.6 % (ref 11.5–15.5)
WBC: 13.6 10*3/uL — AB (ref 4.0–10.5)

## 2018-03-14 LAB — BASIC METABOLIC PANEL
Anion gap: 8 (ref 5–15)
BUN: 6 mg/dL (ref 6–20)
CO2: 27 mmol/L (ref 22–32)
CREATININE: 0.54 mg/dL — AB (ref 0.61–1.24)
Calcium: 8.2 mg/dL — ABNORMAL LOW (ref 8.9–10.3)
Chloride: 99 mmol/L — ABNORMAL LOW (ref 101–111)
GFR calc Af Amer: >60 mL/min (ref >60)
Glucose, Bld: 91 mg/dL (ref 65–99)
POTASSIUM: 3.4 mmol/L — AB (ref 3.5–5.1)
SODIUM: 134 mmol/L — AB (ref 135–145)

## 2018-03-14 LAB — MAGNESIUM: Magnesium: 1.7 mg/dL (ref 1.7–2.4)

## 2018-03-14 MED ORDER — POTASSIUM CHLORIDE CRYS ER 20 MEQ PO TBCR
40.0000 meq | EXTENDED_RELEASE_TABLET | Freq: Once | ORAL | Status: AC
  Administered 2018-03-14: 40 meq via ORAL
  Filled 2018-03-14: qty 2

## 2018-03-14 MED ORDER — GUAIFENESIN ER 600 MG PO TB12
1200.0000 mg | ORAL_TABLET | Freq: Two times a day (BID) | ORAL | Status: DC
  Administered 2018-03-14 – 2018-03-15 (×2): 1200 mg via ORAL
  Filled 2018-03-14 (×2): qty 2

## 2018-03-14 MED ORDER — AMOXICILLIN-POT CLAVULANATE 875-125 MG PO TABS
1.0000 | ORAL_TABLET | Freq: Two times a day (BID) | ORAL | Status: DC
  Administered 2018-03-14 – 2018-03-15 (×2): 1 via ORAL
  Filled 2018-03-14 (×2): qty 1

## 2018-03-14 NOTE — Progress Notes (Signed)
PROGRESS NOTE   Christopher Velez  SLH:[id number]    DOB: 02-21-1981    DOA: 03/10/2018  PCP: Patient, No Pcp Per   I have briefly reviewed patients previous medical records in [facility].  Brief Narrative:  37 year old male, lives with his aunt, works at a warehouse, [place] of alcohol abuse, tobacco abuse, admitted with influenza, apparent community-acquired pneumonia and associated hyponatremia.  He required BiPAP for respiratory distress and was in the ICU under CCM care.  Transferred to hospitalist service on 03/13/18.  Slowly improving.  Possible discharge 03/15/18.   Assessment & Plan:   Principal Problem:   Acute respiratory failure with hypoxia (HCC) Active Problems:   CAP (community acquired pneumonia)   Alcohol withdrawal syndrome without complication (HCC)   Influenza   Influenza A acute bronchitis: Complete 5 days of Tamiflu.  Supportive treatment.  Slowly improving.  Community-acquired multilobar pneumonia: Initiated empirically on IV ceftriaxone and azithromycin.  Sputum culture shows normal respiratory flora.  Blood cultures x2: Negative to date.  Chest exam showed harsh breath sounds, followed up with chest x-ray where there was some concern regarding right trace pneumothorax.  Discussed with radiology and obtain CT chest without contrast which is negative for pneumothorax but shows multi lobar bilateral multifocal pneumonia.  Completed course of azithromycin.  Completed 4 days of IV ceftriaxone.  Transition to oral Augmentin overnight, make sure he tolerates this and possible discharge home in a.m.  Continue Mucinex.  Recommend repeating chest x-ray in 4 weeks to ensure resolution of pneumonia findings.  Acute respiratory failure with hypoxia: Secondary to problems above.  Resolved.  Hyponatremia: Suspected due to beer potomania.  Improved and stabilized.  Hypokalemia: Replace and follow.  Magnesium 1.7.  Alcohol dependence: Volunteers to drinking a 40 ounce beer daily.   Abstinence counseled.  No overt withdrawal.  Continue thiamine, folate and multivitamins.  Abnormal LFTs: Likely related to alcohol dependence.  RUQ ultrasound 4/15 showed suboptimal evaluation of gallbladder.  Please see report for details.  Follow LFTs in a.m.  Right lobe liver lesion: RUQ ultrasound showed 3.7 cm geographic hyperechoic lesion within subcapsular aspect of the right lobe of the liver favored to represent either a hepatic hemangioma or focal fatty infiltration.  Outpatient follow-up and evaluation as deemed necessary.  Tobacco abuse: Cessation counseled.  Sepsis secondary to pneumonia and influenza: Sepsis resolved.  Normocytic anemia: Stable   DVT prophylaxis: Lovenox Code Status: Full Family Communication: None at bedside Disposition: DC home possibly 4/18   Consultants:  CCM  Procedures:  BiPAP  Antimicrobials:  Completed IV azithromycin and ceftriaxone. Oral Augmentin 4/17 >   Subjective: Intermittent cough with yellow sputum.  No chest pain or dyspnea even with activity.  No fever or chills.  Denies any other complaints.  ROS: As above.  Objective:  Vitals:   03/13/18 2118 03/13/18 2200 03/14/18 0542 03/14/18 1359  BP: 118/74  99/77 108/79  Pulse: 96  95 (!) 104  Resp: 18 (!) 24 (!) 22 20  Temp: 98.4 F (36.9 C)  98 F (36.7 C) 99.2 F (37.3 C)  TempSrc: Oral  Oral Oral  SpO2: 96%  95% 97%  Weight:      Height:        Examination:  General exam: Pleasant young male, moderately built and nourished, sitting up comfortably in bed. Respiratory system: Harsh breath sounds bilaterally with scattered bilateral few wheezing and rhonchi and occasional basal crackles.  Some nasal flare. Respiratory effort normal. Cardiovascular system: S1 & S2 heard,  RRR. No JVD, murmurs, rubs, gallops or clicks. No pedal edema.  Telemetry personally reviewed: Sinus rhythm, mild ST at times Gastrointestinal system: Abdomen is nondistended, soft and nontender. No  organomegaly or masses felt. Normal bowel sounds heard. Central nervous system: Alert and oriented. No focal neurological deficits. Extremities: Symmetric 5 x 5 power. Skin: No rashes, lesions or ulcers Psychiatry: Judgement and insight appear normal. Mood & affect appropriate.     Data Reviewed: I have personally reviewed following labs and imaging studies  CBC: Recent Labs  Lab 03/10/18 1438 03/11/18 1218 03/12/18 0443 03/13/18 0353 03/14/18 0344  WBC 23.3* 22.1* 17.2* 14.1* 13.6*  NEUTROABS 17.2* 17.4* 10.4* 7.6 7.1  HGB 11.5* 9.4* 9.3* 9.5* 10.0*  HCT 34.3* 29.4* 28.8* 29.8* 31.9*  MCV 75.6* 76.0* 77.4* 78.0 79.8  PLT 399 408* 450* 535* 350*   Basic Metabolic Panel: Recent Labs  Lab 03/11/18 0602 03/11/18 1218 03/12/18 0443 03/13/18 0353 03/14/18 0344  NA 137 134* 132* 139 134*  K 3.7 3.5 3.1* 3.5 3.4*  CL 101 101 99* 103 99*  CO2 24 24 23 26 27   GLUCOSE 141* 138* 101* 92 91  BUN 8 8 12 7 6   CREATININE 0.61 0.59* 0.56* 0.56* 0.54*  CALCIUM 8.2* 8.1* 8.0* 8.4* 8.2*  MG  --   --   --   --  1.7   Liver Function Tests: Recent Labs  Lab 03/10/18 2035  AST 79*  ALT 58  ALKPHOS 129*  BILITOT 3.6*  PROT 6.1*  ALBUMIN 1.9*   Coagulation Profile: Recent Labs  Lab 03/10/18 2035  INR 1.10   Cardiac Enzymes: Recent Labs  Lab 03/10/18 2035  TROPONINI <0.03   CBG: Recent Labs  Lab 03/10/18 2201  GLUCAP 159*    Recent Results (from the past 240 hour(s))  Blood culture (routine x 2)     Status: None (Preliminary result)   Collection Time: 03/10/18  4:23 PM  Result Value Ref Range Status   Specimen Description BLOOD LEFT UPPER ARM  Final   Special Requests   Final    BOTTLES DRAWN AEROBIC AND ANAEROBIC Blood Culture adequate volume   Culture   Final    NO GROWTH 4 DAYS Performed at [facility], [address], [place], [place] [address]    Report Status PENDING  Incomplete  Blood culture (routine x 2)     Status: None (Preliminary result)     Collection Time: 03/10/18  4:49 PM  Result Value Ref Range Status   Specimen Description BLOOD LEFT IV  Final   Special Requests   Final    BOTTLES DRAWN AEROBIC AND ANAEROBIC Blood Culture adequate volume   Culture   Final    NO GROWTH 4 DAYS Performed at [facility], [place] [address], [place], [place] [address]    Report Status PENDING  Incomplete  MRSA PCR Screening     Status: None   Collection Time: 03/10/18  9:56 PM  Result Value Ref Range Status   MRSA by PCR NEGATIVE NEGATIVE Final    Comment:        The GeneXpert MRSA Assay (FDA approved for NASAL specimens only), is one component of a comprehensive MRSA colonization surveillance program. It is not intended to diagnose MRSA infection nor to guide or monitor treatment for MRSA infections. Performed at [facility], [place] [address] [place],  [address]   Culture, expectorated sputum-assessment     Status: None  Collection Time: 03/11/18  2:03 AM  Result Value Ref Range Status   Specimen Description SPUTUM  Final   Special Requests NONE  Final   Sputum evaluation   Final    THIS SPECIMEN IS ACCEPTABLE FOR SPUTUM CULTURE Performed at [facility], [place] [address], [place], [place] [address]    Report Status 03/11/2018 FINAL  Final  Culture, respiratory (NON-Expectorated)     Status: None   Collection Time: 03/11/18  2:03 AM  Result Value Ref Range Status   Specimen Description SPUTUM  Final   Special Requests NONE Reflexed from [id number]  Final   Gram Stain   Final    ABUNDANT WBC PRESENT,BOTH PMN AND MONONUCLEAR FEW SQUAMOUS EPITHELIAL CELLS PRESENT MODERATE GRAM NEGATIVE RODS FEW GRAM POSITIVE RODS FEW GRAM POSITIVE COCCI IN PAIRS    Culture   Final    FEW Consistent with normal respiratory flora. Performed at [facility], [place] [address] [place], [place] [address]    Report Status 03/13/2018 FINAL  Final         Radiology Studies: Dg Chest 2 View  Result Date:  03/14/2018 CLINICAL DATA:  37 year old male admitted for shortness of breath and pneumonia four days ago. Persistent cough. EXAM: CHEST - 2 VIEW COMPARISON:  Chest radiographs 03/11/2018 and earlier. FINDINGS: Bilateral streaky and confluent lower lobe opacity persists, greater on the left. A small layering pleural effusion has developed since 03/10/2018, and a horizontal configuration of the fluid on both views raises the possibility of pneumothorax, although no pleural edge or pleural air is identified on the right. On the left side there might also be a small effusion, uncertain. The upper lungs remain clear. No areas of worsening ventilation. Stable cardiac size and mediastinal contours. Visualized tracheal air column is within normal limits. No acute osseous abnormality identified. Negative visible bowel gas pattern. IMPRESSION: 1. No significant improvement in bilateral lower lung opacity since 03/10/2018. This is nonspecific but compatible with bilateral pneumonia. 2. Small right pleural effusion has developed. The horizontal appearance of the fluid meniscus raises the possibility of a trace right pneumothorax, but no pneumothorax is identified. 3. No other new cardiopulmonary abnormality. Electronically Signed   By: Genevie Ann M.D.   On: 03/14/2018 11:07   Ct Chest Wo Contrast  Result Date: 03/14/2018 CLINICAL DATA:  37 year old male improving clinically following hospitalization for shortness of breath, cough and presumed pneumonia. New small right pleural effusion on radiographs earlier today, with questionable evidence of a right pneumothorax. The patient was being considered for possible discharge today. On discussion by telephone with Dr. Vernell Leep we agreed to follow-up noncontrast chest CT to evaluate the possibility of trace right pneumothorax. EXAM: CT CHEST WITHOUT CONTRAST TECHNIQUE: Multidetector CT imaging of the chest was performed following the standard protocol without IV contrast.  COMPARISON:  Chest radiographs 1113 hours today, and earlier. FINDINGS: Cardiovascular: Calcified left coronary artery atherosclerosis is evident. Vascular patency is not evaluated in the absence of IV contrast. No cardiomegaly or pericardial effusion. Mediastinum/Nodes: No mediastinal lymphadenopathy. No hilar lymphadenopathy is evident. Lungs/Pleura: The major airways are patent. No pneumothorax.  Small or trace right pleural effusion. There is scattered, mostly sub solid ground-glass type peribronchial nodularity in some of the inferior aspect of the right lower lobe. Similar mild peribronchial nodular opacity in the lateral basal segment of the right middle lobe. Confluent medial basal segment middle lobe consolidation. There is a small area of consolidation also in the medial basal segment  of the right lower lobe, with otherwise mild right lower lobe peribronchial nodularity. There is also a trace layering left pleural effusion. No left pneumothorax. There is similar scattered peribronchial ground-glass nodularity in the left upper lobe and lingula. There is confluent left lower lobe peribronchial and costophrenic angle consolidation. Upper Abdomen: Negative visible noncontrast liver, spleen, pancreas, adrenal glands, and left renal upper pole in the upper abdomen. Small splenule (normal variant) incidentally noted along the medial spleen. Negative visible bowel in the upper abdomen. Musculoskeletal: No acute osseous abnormality identified. There is a chronic posterior right 11th rib fracture. IMPRESSION: 1. Negative for pneumothorax. There are small right and trace left layering pleural effusions. 2. Multilobar peribronchial consolidation compatible with bilateral multifocal pneumonia. Right middle lobe, left lower lobe, and to a lesser extent right lower lobe involvement. 3. Additional mild scattered peribronchial ground-glass nodularity in all lobes also felt related to the acute infectious process. 4.  Calcified left coronary artery atherosclerosis. No pericardial effusion. Electronically Signed   By: Genevie Ann M.D.   On: 03/14/2018 16:20        Scheduled Meds: . amoxicillin-clavulanate  1 tablet Oral BID  . enoxaparin (LOVENOX) injection  40 mg Subcutaneous Q24H  . folic acid  1 mg Oral Daily  . guaiFENesin  1,200 mg Oral BID  . multivitamin with minerals  1 tablet Oral Daily  . oseltamivir  75 mg Oral BID  . thiamine  100 mg Oral Daily   Continuous Infusions:   LOS: 4 days     Vernell Leep, MD, FACP, [facility]. [facility] Pager [phone]  If 7PM-7AM, please contact night-coverage [email or web address] Password [facility] 03/14/2018, 4:49 PM

## 2018-03-14 NOTE — Care Management Note (Signed)
Case Management Note  Patient Details  Name: Dequavion Follette MRN: [id number] Date of Birth: 1981-04-22  Subjective/Objective:        Acute respiratory failure with hypoxia/CAP/Influenza. From home with Aunt. Pt states he does work. PTA independent with ADL's, no DME usage.               Action/Plan: Transition to home today. Pt without PCP/no insurance. NCM provided pt with [facility] information with explanation. Pt plans to call clinic on Monday to scheduled hospital f/u.  NCM will f/u regarding medication needs... [place] vs [place] , awaiting d/c prescriptions.  Expected Discharge Date:    03/14/2018             Expected Discharge Plan:  Home/Self Care  In-House Referral:     Discharge planning Services  CM Consult, [facility]  Post Acute Care Choice:    Choice offered to:     DME Arranged:   N/A DME Agency:   N/A  HH Arranged:   N/A HH Agency:   N/A  Status of Service:  Completed, signed off  If discussed at [place] of Stay Meetings, dates discussed:    Additional Comments:  Sharin Mons, RN 03/14/2018, 11:35 AM

## 2018-03-14 NOTE — Progress Notes (Signed)
SATURATION QUALIFICATIONS: (This note is used to comply with regulatory documentation for home oxygen)  Patient Saturations on Room Air at Rest = 96%  Patient Saturations on Room Air while Ambulating = 95-97%   *Patient maintained on room air at rest and with ambulating.

## 2018-03-15 DIAGNOSIS — J111 Influenza due to unidentified influenza virus with other respiratory manifestations: Secondary | ICD-10-CM

## 2018-03-15 DIAGNOSIS — E871 Hypo-osmolality and hyponatremia: Secondary | ICD-10-CM

## 2018-03-15 DIAGNOSIS — A419 Sepsis, unspecified organism: Secondary | ICD-10-CM

## 2018-03-15 LAB — BASIC METABOLIC PANEL
Anion gap: 14 (ref 5–15)
BUN: 7 mg/dL (ref 6–20)
CALCIUM: 8.5 mg/dL — AB (ref 8.9–10.3)
CO2: 25 mmol/L (ref 22–32)
CREATININE: 0.69 mg/dL (ref 0.61–1.24)
Chloride: 96 mmol/L — ABNORMAL LOW (ref 101–111)
GFR calc non Af Amer: >60 mL/min (ref >60)
Glucose, Bld: 45 mg/dL — ABNORMAL LOW (ref 65–99)
Potassium: 4.1 mmol/L (ref 3.5–5.1)
Sodium: 135 mmol/L (ref 135–145)

## 2018-03-15 LAB — HEPATIC FUNCTION PANEL
ALBUMIN: 2.3 g/dL — AB (ref 3.5–5.0)
ALK PHOS: 143 U/L — AB (ref 38–126)
ALT: 74 U/L — ABNORMAL HIGH (ref 17–63)
AST: 51 U/L — ABNORMAL HIGH (ref 15–41)
BILIRUBIN TOTAL: 0.7 mg/dL (ref 0.3–1.2)
Bilirubin, Direct: 0.2 mg/dL (ref 0.1–0.5)
Indirect Bilirubin: 0.5 mg/dL (ref 0.3–0.9)
Total Protein: 6.5 g/dL (ref 6.5–8.1)

## 2018-03-15 LAB — GLUCOSE, CAPILLARY: GLUCOSE-CAPILLARY: 98 mg/dL (ref 65–99)

## 2018-03-15 LAB — CULTURE, BLOOD (ROUTINE X 2)
CULTURE: NO GROWTH
Culture: NO GROWTH
Special Requests: ADEQUATE
Special Requests: ADEQUATE

## 2018-03-15 LAB — CBC
HEMATOCRIT: 33.3 % — AB (ref 39.0–52.0)
Hemoglobin: 10.1 g/dL — ABNORMAL LOW (ref 13.0–17.0)
MCH: 24.9 pg — AB (ref 26.0–34.0)
MCHC: 30.3 g/dL (ref 30.0–36.0)
MCV: 82 fL (ref 78.0–100.0)
Platelets: 599 10*3/uL — ABNORMAL HIGH (ref 150–400)
RBC: 4.06 MIL/uL — ABNORMAL LOW (ref 4.22–5.81)
RDW: 13.6 % (ref 11.5–15.5)
WBC: 13.9 10*3/uL — ABNORMAL HIGH (ref 4.0–10.5)

## 2018-03-15 LAB — HIV ANTIBODY (ROUTINE TESTING W REFLEX): HIV Screen 4th Generation wRfx: NONREACTIVE

## 2018-03-15 MED ORDER — THIAMINE HCL 100 MG PO TABS: 100.0000 mg | ORAL_TABLET | Freq: Every day | ORAL | 0 refills | Status: AC

## 2018-03-15 MED ORDER — FOLIC ACID 1 MG PO TABS: 1.0000 mg | ORAL_TABLET | Freq: Every day | ORAL | 0 refills | Status: AC

## 2018-03-15 MED ORDER — AMOXICILLIN-POT CLAVULANATE 875-125 MG PO TABS: 1.0000 | ORAL_TABLET | Freq: Two times a day (BID) | ORAL | 0 refills | Status: DC

## 2018-03-15 MED ORDER — ADULT MULTIVITAMIN W/MINERALS CH: 1.0000 | ORAL_TABLET | Freq: Every day | ORAL | Status: AC

## 2018-03-15 NOTE — Discharge Summary (Addendum)
Physician Discharge Summary  Christopher Velez BJS:[id number] DOB: 28-Sep-1981  PCP: Patient, No Pcp Per  Admit date: 03/10/2018 Discharge date: 03/15/2018  Recommendations for Outpatient Follow-up:  1. [facility]/new PCP: As per case management, patient is advised to call on Monday, 03/19/18 at 8:30 AM to schedule hospital follow-up appointment.  Recommend repeating labs (CBC & CMP) in 1 week from hospital discharge.  Please follow final blood culture results that were sent from the hospital. 2. Recommend repeating chest x-ray in 4 weeks to ensure resolution of pneumonia findings. 3. Outpatient evaluation of right liver lobe lesion seen on ultrasound as outpatient as deemed necessary.  Home Health: None Equipment/Devices: None  Discharge Condition: Improved and stable CODE STATUS: Full Diet recommendation: Regular diet  Discharge Diagnoses:  Principal Problem:   Acute respiratory failure with hypoxia (HCC) Active Problems:   CAP (community acquired pneumonia)   Alcohol withdrawal syndrome without complication (HCC)   Influenza   Brief Summary: 37 year old male, lives with his aunt, works at a warehouse, PMH of alcohol abuse, tobacco abuse, admitted with influenza, apparent community-acquired pneumonia and associated hyponatremia.  He required BiPAP for respiratory distress and was in the ICU under CCM care.  Transferred to hospitalist service on 03/13/18.     Assessment & Plan:   Influenza A acute bronchitis: Completed 5 days of Tamiflu.  Provided supportive treatment.  Improved.  Community-acquired multilobar pneumonia: Initiated empirically on IV ceftriaxone and azithromycin.  Sputum culture shows normal respiratory flora.  Blood cultures x2: Negative to date.  Chest exam on 4/17 showed significant harsh breath sounds and hence was followed up with chest x-ray.  Radiologist expressed some concern (very low index of suspicion) regarding right trace  pneumothorax.  Discussed with Radiology and obtained CT chest without contrast which was negative for pneumothorax but showed multi lobar bilateral multifocal pneumonia.  Completed 5 days course of azithromycin.  Completed 4 days of IV ceftriaxone.  Transitioned to oral Augmentin to complete total 7 days treatment.  Continue Mucinex.  Recommend repeating chest x-ray in 4 weeks to ensure resolution of pneumonia findings.  HIV screen negative.  Improved.  Acute respiratory failure with hypoxia: Secondary to problems above.  Resolved.  Hyponatremia: Suspected due to beer potomania.    Resolved.  Hypokalemia:  Replaced.  Magnesium 1.7.  Alcohol dependence: Volunteers to drinking a 40 ounce beer daily.  Abstinence counseled.  No overt withdrawal.  Continue thiamine, folate and multivitamins.  Abnormal LFTs: Likely related to alcohol dependence.  RUQ ultrasound 4/15 showed suboptimal evaluation of gallbladder.  Please see report for details.    Repeat LFT shows total bilirubin has normalized, AST and ALT minimally elevated and stable.  Right lobe liver lesion: RUQ ultrasound showed 3.7 cm geographic hyperechoic lesion within subcapsular aspect of the right lobe of the liver favored to represent either a hepatic hemangioma or focal fatty infiltration.  Outpatient follow-up and evaluation as deemed necessary.  Tobacco abuse: Cessation counseled.  Sepsis secondary to pneumonia and influenza: Sepsis resolved.  Normocytic anemia: Stable.  Outpatient follow-up and evaluation as deemed necessary.  Low blood glucose: Noted asymptomatic low blood sugar/45 mg per DL on this morning's lab.?  Lab error.  Followed up with stat CBG which was normal at 98 mg per DL.      Consultants:  CCM  Procedures:  BiPAP   Discharge Instructions  Discharge Instructions    Activity as tolerated - No restrictions   Complete by:  As directed  Call MD for:  difficulty breathing, headache or visual  disturbances   Complete by:  As directed    Call MD for:  extreme fatigue   Complete by:  As directed    Call MD for:  persistant dizziness or light-headedness   Complete by:  As directed    Call MD for:  temperature >100.4   Complete by:  As directed    Diet general   Complete by:  As directed        Medication List    STOP taking these medications   ciprofloxacin 500 MG tablet Commonly known as:  CIPRO   ibuprofen 600 MG tablet Commonly known as:  ADVIL,MOTRIN   oxyCODONE-acetaminophen 5-325 MG tablet Commonly known as:  PERCOCET/ROXICET     TAKE these medications   amoxicillin-clavulanate 875-125 MG tablet Commonly known as:  AUGMENTIN Take 1 tablet by mouth 2 (two) times daily.   folic acid 1 MG tablet Commonly known as:  FOLVITE Take 1 tablet (1 mg total) by mouth daily. Start taking on:  03/16/2018   guaiFENesin 600 MG 12 hr tablet Commonly known as:  MUCINEX Take 600 mg by mouth 2 (two) times daily. What changed:  Another medication with the same name was removed. Continue taking this medication, and follow the directions you see here.   multivitamin with minerals Tabs tablet Take 1 tablet by mouth daily. Start taking on:  03/16/2018   thiamine 100 MG tablet Take 1 tablet (100 mg total) by mouth daily. Start taking on:  03/16/2018      Follow-up Information    [facility]. Call on 03/19/2018.   Why:  Please call Monday 03/19/2018 at 8:30 am to schedule hospital follow appointment.  Recommend repeating labs (CBC & CMP) in 1 week from hospital discharge and chest x-ray in 4 weeks. Contact information: [place] [phone]         No Known Allergies    Procedures/Studies: Dg Chest 2 View  Result Date: 03/14/2018 CLINICAL DATA:  37 year old male admitted for shortness of breath and pneumonia four days ago. Persistent cough. EXAM: CHEST - 2 VIEW COMPARISON:  Chest  radiographs 03/11/2018 and earlier. FINDINGS: Bilateral streaky and confluent lower lobe opacity persists, greater on the left. A small layering pleural effusion has developed since 03/10/2018, and a horizontal configuration of the fluid on both views raises the possibility of pneumothorax, although no pleural edge or pleural air is identified on the right. On the left side there might also be a small effusion, uncertain. The upper lungs remain clear. No areas of worsening ventilation. Stable cardiac size and mediastinal contours. Visualized tracheal air column is within normal limits. No acute osseous abnormality identified. Negative visible bowel gas pattern. IMPRESSION: 1. No significant improvement in bilateral lower lung opacity since 03/10/2018. This is nonspecific but compatible with bilateral pneumonia. 2. Small right pleural effusion has developed. The horizontal appearance of the fluid meniscus raises the possibility of a trace right pneumothorax, but no pneumothorax is identified. 3. No other new cardiopulmonary abnormality. Electronically Signed   By: Genevie Ann M.D.   On: 03/14/2018 11:07   Dg Chest 2 View  Result Date: 03/10/2018 CLINICAL DATA:  Chest pain and shortness of breath EXAM: CHEST - 2 VIEW COMPARISON:  None. FINDINGS: There is patchy airspace consolidation in the left base and the right middle lobe. Lungs elsewhere clear. Heart size and pulmonary vascularity are normal. No adenopathy. No bone  lesions. IMPRESSION: Infiltrate consistent with pneumonia left lower lobe and right middle lobe. Lungs elsewhere clear. No adenopathy evident. Electronically Signed   By: Lowella Grip III M.D.   On: 03/10/2018 15:36   Ct Chest Wo Contrast  Result Date: 03/14/2018 CLINICAL DATA:  37 year old male improving clinically following hospitalization for shortness of breath, cough and presumed pneumonia. New small right pleural effusion on radiographs earlier today, with questionable evidence of a  right pneumothorax. The patient was being considered for possible discharge today. On discussion by telephone with Dr. Vernell Leep we agreed to follow-up noncontrast chest CT to evaluate the possibility of trace right pneumothorax. EXAM: CT CHEST WITHOUT CONTRAST TECHNIQUE: Multidetector CT imaging of the chest was performed following the standard protocol without IV contrast. COMPARISON:  Chest radiographs 1113 hours today, and earlier. FINDINGS: Cardiovascular: Calcified left coronary artery atherosclerosis is evident. Vascular patency is not evaluated in the absence of IV contrast. No cardiomegaly or pericardial effusion. Mediastinum/Nodes: No mediastinal lymphadenopathy. No hilar lymphadenopathy is evident. Lungs/Pleura: The major airways are patent. No pneumothorax.  Small or trace right pleural effusion. There is scattered, mostly sub solid ground-glass type peribronchial nodularity in some of the inferior aspect of the right lower lobe. Similar mild peribronchial nodular opacity in the lateral basal segment of the right middle lobe. Confluent medial basal segment middle lobe consolidation. There is a small area of consolidation also in the medial basal segment of the right lower lobe, with otherwise mild right lower lobe peribronchial nodularity. There is also a trace layering left pleural effusion. No left pneumothorax. There is similar scattered peribronchial ground-glass nodularity in the left upper lobe and lingula. There is confluent left lower lobe peribronchial and costophrenic angle consolidation. Upper Abdomen: Negative visible noncontrast liver, spleen, pancreas, adrenal glands, and left renal upper pole in the upper abdomen. Small splenule (normal variant) incidentally noted along the medial spleen. Negative visible bowel in the upper abdomen. Musculoskeletal: No acute osseous abnormality identified. There is a chronic posterior right 11th rib fracture. IMPRESSION: 1. Negative for pneumothorax.  There are small right and trace left layering pleural effusions. 2. Multilobar peribronchial consolidation compatible with bilateral multifocal pneumonia. Right middle lobe, left lower lobe, and to a lesser extent right lower lobe involvement. 3. Additional mild scattered peribronchial ground-glass nodularity in all lobes also felt related to the acute infectious process. 4. Calcified left coronary artery atherosclerosis. No pericardial effusion. Electronically Signed   By: Genevie Ann M.D.   On: 03/14/2018 16:20   US Abdomen Limited  Result Date: 03/12/2018 CLINICAL DATA:  Hyperbilirubinemia. Sepsis. History of alcohol abuse. EXAM: ULTRASOUND ABDOMEN LIMITED RIGHT UPPER QUADRANT COMPARISON:  None. FINDINGS: Gallbladder: The gallbladder is underdistended and thus suboptimally evaluated. Suspected minimal amount of gallbladder wall thickening measuring 4 mm in diameter, potentially accentuated due to underdistention. No pericholecystic fluid. No echogenic stones or biliary sludge. Negative sonographic Murphy's sign. Common bile duct: Diameter: Normal in size measuring 3.6 mm in diameter Liver: Note is made of a approximately 3.7 x 1.9 x 2.1 cm geographic hyperechoic lesion within the subcapsular aspect of the right lobe of the liver (images 46 through 49) with associated increased through transmission favored to represent either hepatic hemangioma or focal fatty infiltration (favored). No additional discrete hepatic lesions. No intrahepatic biliary ductal dilatation. Portal vein is patent on color Doppler imaging with normal direction of blood flow towards the liver. IMPRESSION: 1. Suboptimal evaluation of the gallbladder secondary to underdistention. If biliary dyskinesia is of clinical concern, further evaluation  with nuclear medicine HIDA scan with CCK augmentation could be performed as indicated. 2. Approximately 3.7 cm geographic hyperechoic lesion within subcapsular aspect the right lobe of the liver favored to  represent either a hepatic hemangioma or focal fatty infiltration (favored). Electronically Signed   By: Sandi Mariscal M.D.   On: 03/12/2018 07:27   Dg Chest Port 1 View  Result Date: 03/11/2018 CLINICAL DATA:  Community acquired pneumonia. EXAM: PORTABLE CHEST 1 VIEW COMPARISON:  03/10/2018 FINDINGS: The heart size and mediastinal contours are within normal limits. Bibasilar infiltrates show no significant change. No evidence of pleural effusion. IMPRESSION: Bibasilar pulmonary infiltrates, without significant change. Electronically Signed   By: Earle Gell M.D.   On: 03/11/2018 08:07      Subjective: Patient denies complaints.  No chest pain, cough, dyspnea, fever or chills reported.  As per RN, no acute issues noted.  Discharge Exam:  Vitals:   03/14/18 1359 03/14/18 2002 03/15/18 0022 03/15/18 0405  BP: 108/79 93/63 92/67  93/68  Pulse: (!) 104 99 88 86  Resp: 20 (!) 24 (!) 24 20  Temp: 99.2 F (37.3 C) 98.5 F (36.9 C) 98.2 F (36.8 C) 98 F (36.7 C)  TempSrc: Oral     SpO2: 97% 98% 96% 96%  Weight:      Height:        General exam: Pleasant young male, moderately built and nourished, seen ambulating comfortably in the halls on room air without distress. Respiratory system:  Improved breath sounds.  Slightly harsh posteriorly.  Clear anteriorly.  No wheezing, rhonchi or crackles appreciated.  No increased work of breathing. Cardiovascular system: S1 & S2 heard, RRR. No JVD, murmurs, rubs, gallops or clicks. No pedal edema.   Gastrointestinal system: Abdomen is nondistended, soft and nontender. No organomegaly or masses felt. Normal bowel sounds heard. Central nervous system: Alert and oriented. No focal neurological deficits. Extremities: Symmetric 5 x 5 power. Skin: No rashes, lesions or ulcers Psychiatry: Judgement and insight appear normal. Mood & affect appropriate.      The results of significant diagnostics from this hospitalization (including imaging, microbiology,  ancillary and laboratory) are listed below for reference.     Microbiology: Recent Results (from the past 240 hour(s))  Blood culture (routine x 2)     Status: None (Preliminary result)   Collection Time: 03/10/18  4:23 PM  Result Value Ref Range Status   Specimen Description BLOOD LEFT UPPER ARM  Final   Special Requests   Final    BOTTLES DRAWN AEROBIC AND ANAEROBIC Blood Culture adequate volume   Culture   Final    NO GROWTH 4 DAYS Performed at [facility], [address], [place], [place] [address]    Report Status PENDING  Incomplete  Blood culture (routine x 2)     Status: None (Preliminary result)   Collection Time: 03/10/18  4:49 PM  Result Value Ref Range Status   Specimen Description BLOOD LEFT IV  Final   Special Requests   Final    BOTTLES DRAWN AEROBIC AND ANAEROBIC Blood Culture adequate volume   Culture   Final    NO GROWTH 4 DAYS Performed at [facility], [place] [address] [place], [place] [address]    Report Status PENDING  Incomplete  MRSA PCR Screening     Status: None   Collection Time: 03/10/18  9:56 PM  Result Value Ref Range Status   MRSA by PCR NEGATIVE NEGATIVE Final    Comment:  The GeneXpert MRSA Assay (FDA approved for NASAL specimens only), is one component of a comprehensive MRSA colonization surveillance program. It is not intended to diagnose MRSA infection nor to guide or monitor treatment for MRSA infections. Performed at [facility], [place] [address] [place], [place] [address]   Culture, expectorated sputum-assessment     Status: None   Collection Time: 03/11/18  2:03 AM  Result Value Ref Range Status   Specimen Description SPUTUM  Final   Special Requests NONE  Final   Sputum evaluation   Final    THIS SPECIMEN IS ACCEPTABLE FOR SPUTUM CULTURE Performed at [facility], [address], [place], [place] [address]    Report Status 03/11/2018 FINAL  Final  Culture, respiratory (NON-Expectorated)      Status: None   Collection Time: 03/11/18  2:03 AM  Result Value Ref Range Status   Specimen Description SPUTUM  Final   Special Requests NONE Reflexed from [id number]  Final   Gram Stain   Final    ABUNDANT WBC PRESENT,BOTH PMN AND MONONUCLEAR FEW SQUAMOUS EPITHELIAL CELLS PRESENT MODERATE GRAM NEGATIVE RODS FEW GRAM POSITIVE RODS FEW GRAM POSITIVE COCCI IN PAIRS    Culture   Final    FEW Consistent with normal respiratory flora. Performed at [facility], [place] [address] [place], [place] [address]    Report Status 03/13/2018 FINAL  Final     Labs: CBC: Recent Labs  Lab 03/10/18 1438 03/11/18 1218 03/12/18 0443 03/13/18 0353 03/14/18 0344 03/15/18 0202  WBC 23.3* 22.1* 17.2* 14.1* 13.6* 13.9*  NEUTROABS 17.2* 17.4* 10.4* 7.6 7.1  --   HGB 11.5* 9.4* 9.3* 9.5* 10.0* 10.1*  HCT 34.3* 29.4* 28.8* 29.8* 31.9* 33.3*  MCV 75.6* 76.0* 77.4* 78.0 79.8 82.0  PLT 399 408* 450* 535* 556* 128*   Basic Metabolic Panel: Recent Labs  Lab 03/11/18 1218 03/12/18 0443 03/13/18 0353 03/14/18 0344 03/15/18 0202  NA 134* 132* 139 134* 135  K 3.5 3.1* 3.5 3.4* 4.1  CL 101 99* 103 99* 96*  CO2 24 23 26 27 25   GLUCOSE 138* 101* 92 91 45*  BUN 8 12 7 6 7   CREATININE 0.59* 0.56* 0.56* 0.54* 0.69  CALCIUM 8.1* 8.0* 8.4* 8.2* 8.5*  MG  --   --   --  1.7  --    Liver Function Tests: Recent Labs  Lab 03/10/18 2035 03/15/18 0202  AST 79* 51*  ALT 58 74*  ALKPHOS 129* 143*  BILITOT 3.6* 0.7  PROT 6.1* 6.5  ALBUMIN 1.9* 2.3*    Cardiac Enzymes: Recent Labs  Lab 03/10/18 2035  TROPONINI <0.03   CBG: Recent Labs  Lab 03/10/18 2201 03/15/18 0816  GLUCAP 159* 98   Urinalysis    Component Value Date/Time   COLORURINE AMBER (A) 03/10/2018 1939   APPEARANCEUR CLEAR 03/10/2018 1939   LABSPEC 1.010 03/10/2018 1939   PHURINE 6.0 03/10/2018 1939   GLUCOSEU NEGATIVE 03/10/2018 1939   HGBUR SMALL (A) 03/10/2018 1939   BILIRUBINUR SMALL (A) 03/10/2018 1939   KETONESUR 5  (A) 03/10/2018 1939   PROTEINUR 30 (A) 03/10/2018 1939   NITRITE NEGATIVE 03/10/2018 1939   LEUKOCYTESUR NEGATIVE 03/10/2018 1939      Time coordinating discharge: 25 minutes.  SIGNED:  Vernell Leep, MD, FACP, [facility]. [facility] Pager [phone]  If 7PM-7AM, please contact night-coverage [email or web address] Password [facility] 03/15/2018, 11:29 AM

## 2018-03-15 NOTE — Progress Notes (Signed)
Pt given discharge instructions, prescriptions, and care notes. Pt verbalized understanding AEB no further questions or concerns at this time. IV was discontinued, no redness, pain, or swelling noted at this time. Pt to leave the floor via wheelchair with staff in stable condition. 

## 2018-03-15 NOTE — Progress Notes (Signed)
SATURATION QUALIFICATIONS: (This note is used to comply with regulatory documentation for home oxygen)  Patient Saturations on Room Air at Rest = 95%  Patient Saturations on Room Air while Ambulating = 95-98%  Please briefly explain why patient needs home oxygen: N/A

## 2018-03-15 NOTE — Discharge Instructions (Signed)
Please get your medications reviewed and adjusted by your Primary MD. ° °Please request your Primary MD to go over all Hospital Tests and Procedure/Radiological results at the follow up, please get all Hospital records sent to your Prim MD by signing hospital release before you go home. ° °If you had Pneumonia of Lung problems at the Hospital: °Please get a 2 view Chest X ray done in 6-8 weeks after hospital discharge or sooner if instructed by your Primary MD. ° °If you have Congestive Heart Failure: °Please call your Cardiologist or Primary MD anytime you have any of the following symptoms:  °1) 3 pound weight gain in 24 hours or 5 pounds in 1 week  °2) shortness of breath, with or without a dry hacking cough  °3) swelling in the hands, feet or stomach  °4) if you have to sleep on extra pillows at night in order to breathe ° °Follow cardiac low salt diet and 1.5 lit/day fluid restriction. ° °If you have diabetes °Accuchecks 4 times/day, Once in AM empty stomach and then before each meal. °Log in all results and show them to your primary doctor at your next visit. °If any glucose reading is under 80 or above 300 call your primary MD immediately. ° °If you have Seizure/Convulsions/Epilepsy: °Please do not drive, operate heavy machinery, participate in activities at heights or participate in high speed sports until you have seen by Primary MD or a Neurologist and advised to do so again. ° °If you had Gastrointestinal Bleeding: °Please ask your Primary MD to check a complete blood count within one week of discharge or at your next visit. Your endoscopic/colonoscopic biopsies that are pending at the time of discharge, will also need to followed by your Primary MD. ° °Get Medicines reviewed and adjusted. °Please take all your medications with you for your next visit with your Primary MD ° °Please request your Primary MD to go over all hospital tests and procedure/radiological results at the follow up, please ask your  Primary MD to get all Hospital records sent to his/her office. ° °If you experience worsening of your admission symptoms, develop shortness of breath, life threatening emergency, suicidal or homicidal thoughts you must seek medical attention immediately by calling 911 or calling your MD immediately  if symptoms less severe. ° °You must read complete instructions/literature along with all the possible adverse reactions/side effects for all the Medicines you take and that have been prescribed to you. Take any new Medicines after you have completely understood and accpet all the possible adverse reactions/side effects.  ° °Do not drive or operate heavy machinery when taking Pain medications.  ° °Do not take more than prescribed Pain, Sleep and Anxiety Medications ° °Special Instructions: If you have smoked or chewed Tobacco  in the last 2 yrs please stop smoking, stop any regular Alcohol  and or any Recreational drug use. ° °Wear Seat belts while driving. ° °Please note °You were cared for by a hospitalist during your hospital stay. If you have any questions about your discharge medications or the care you received while you were in the hospital after you are discharged, you can call the unit and asked to speak with the hospitalist on call if the hospitalist that took care of you is not available. Once you are discharged, your primary care physician will handle any further medical issues. Please note that NO REFILLS for any discharge medications will be authorized once you are discharged, as it is imperative that you   return to your primary care physician (or establish a relationship with a primary care physician if you do not have one) for your aftercare needs so that they can reassess your need for medications and monitor your lab values.  You can reach the hospitalist office at phone [phone] or fax [phone]   If you do not have a primary care physician, you can call [phone] for a physician  referral.   Influenza, Adult Influenza, more commonly known as the flu, is a viral infection that primarily affects the respiratory tract. The respiratory tract includes organs that help you breathe, such as the lungs, nose, and throat. The flu causes many common cold symptoms, as well as a high fever and body aches. The flu spreads easily from person to person (is contagious). Getting a flu shot (influenza vaccination) every year is the best way to prevent influenza. What are the causes? Influenza is caused by a virus. You can catch the virus by:  Breathing in droplets from an infected person's cough or sneeze.  Touching something that was recently contaminated with the virus and then touching your mouth, nose, or eyes.  What increases the risk? The following factors may make you more likely to get the flu:  Not cleaning your hands frequently with soap and water or alcohol-based hand sanitizer.  Having close contact with many people during cold and flu season.  Touching your mouth, eyes, or nose without washing or sanitizing your hands first.  Not drinking enough fluids or not eating a healthy diet.  Not getting enough sleep or exercise.  Being under a high amount of stress.  Not getting a yearly (annual) flu shot.  You may be at a higher risk of complications from the flu, such as a severe lung infection (pneumonia), if you:  Are over the age of 83.  Are pregnant.  Have a weakened disease-fighting system (immune system). You may have a weakened immune system if you: ? Have HIV or AIDS. ? Are undergoing chemotherapy. ? Aretaking medicines that reduce the activity of (suppress) the immune system.  Have a long-term (chronic) illness, such as heart disease, kidney disease, diabetes, or lung disease.  Have a liver disorder.  Are obese.  Have anemia.  What are the signs or symptoms? Symptoms of this condition typically last 4-10 days and may  include:  Fever.  Chills.  Headache, body aches, or muscle aches.  Sore throat.  Cough.  Runny or congested nose.  Chest discomfort and cough.  Poor appetite.  Weakness or tiredness (fatigue).  Dizziness.  Nausea or vomiting.  How is this diagnosed? This condition may be diagnosed based on your medical history and a physical exam. Your health care provider may do a nose or throat swab test to confirm the diagnosis. How is this treated? If influenza is detected early, you can be treated with antiviral medicine that can reduce the length of your illness and the severity of your symptoms. This medicine may be given by mouth (orally) or through an IV tube that is inserted in one of your veins. The goal of treatment is to relieve symptoms by taking care of yourself at home. This may include taking over-the-counter medicines, drinking plenty of fluids, and adding humidity to the air in your home. In some cases, influenza goes away on its own. Severe influenza or complications from influenza may be treated in a hospital. Follow these instructions at home:  Take over-the-counter and prescription medicines only as told by your health  care provider.  Use a cool mist humidifier to add humidity to the air in your home. This can make breathing easier.  Rest as needed.  Drink enough fluid to keep your urine clear or pale yellow.  Cover your mouth and nose when you cough or sneeze.  Wash your hands with soap and water often, especially after you cough or sneeze. If soap and water are not available, use hand sanitizer.  Stay home from work or school as told by your health care provider. Unless you are visiting your health care provider, try to avoid leaving home until your fever has been gone for 24 hours without the use of medicine.  Keep all follow-up visits as told by your health care provider. This is important. How is this prevented?  Getting an annual flu shot is the best way  to avoid getting the flu. You may get the flu shot in late summer, fall, or winter. Ask your health care provider when you should get your flu shot.  Wash your hands often or use hand sanitizer often.  Avoid contact with people who are sick during cold and flu season.  Eat a healthy diet, drink plenty of fluids, get enough sleep, and exercise regularly. Contact a health care provider if:  You develop new symptoms.  You have: ? Chest pain. ? Diarrhea. ? A fever.  Your cough gets worse.  You produce more mucus.  You feel nauseous or you vomit. Get help right away if:  You develop shortness of breath or difficulty breathing.  Your skin or nails turn a bluish color.  You have severe pain or stiffness in your neck.  You develop a sudden headache or sudden pain in your face or ear.  You cannot stop vomiting. This information is not intended to replace advice given to you by your health care provider. Make sure you discuss any questions you have with your health care provider. Document Released: 11/11/2000 Document Revised: 04/21/2016 Document Reviewed: 09/08/2015 Elsevier Interactive Patient Education  2017 [place] Pneumonia, Adult Pneumonia is an infection of the lungs. One type of pneumonia can happen while a person is in a hospital. A different type can happen when a person is not in a hospital (community-acquired pneumonia). It is easy for this kind to spread from person to person. It can spread to you if you breathe near an infected person who coughs or sneezes. Some symptoms include:  A dry cough.  A wet (productive) cough.  Fever.  Sweating.  Chest pain.  Follow these instructions at home:  Take over-the-counter and prescription medicines only as told by your doctor. ? Only take cough medicine if you are losing sleep. ? If you were prescribed an antibiotic medicine, take it as told by your doctor. Do not stop taking the antibiotic  even if you start to feel better.  Sleep with your head and neck raised (elevated). You can do this by putting a few pillows under your head, or you can sleep in a recliner.  Do not use tobacco products. These include cigarettes, chewing tobacco, and e-cigarettes. If you need help quitting, ask your doctor.  Drink enough water to keep your pee (urine) clear or pale yellow. A shot (vaccine) can help prevent pneumonia. Shots are often suggested for:  People older than 37 years of age.  People older than 37 years of age: ? Who are having cancer treatment. ? Who have long-term (chronic) lung disease. ? Who have problems  with their body's defense system (immune system).  You may also prevent pneumonia if you take these actions:  Get the flu (influenza) shot every year.  Go to the dentist as often as told.  Wash your hands often. If soap and water are not available, use hand sanitizer.  Contact a doctor if:  You have a fever.  You lose sleep because your cough medicine does not help. Get help right away if:  You are short of breath and it gets worse.  You have more chest pain.  Your sickness gets worse. This is very serious if: ? You are an older adult. ? Your body's defense system is weak.  You cough up blood. This information is not intended to replace advice given to you by your health care provider. Make sure you discuss any questions you have with your health care provider. Document Released: 05/02/2008 Document Revised: 04/21/2016 Document Reviewed: 03/11/2015 Elsevier Interactive Patient Education  [employer].

## 2018-03-21 ENCOUNTER — Encounter: Payer: Self-pay | Admitting: Internal Medicine

## 2018-03-21 ENCOUNTER — Ambulatory Visit: Payer: Self-pay | Attending: Internal Medicine | Admitting: Internal Medicine

## 2018-03-21 VITALS — BP 137/88 | HR 113 | Temp 99.0°F | Resp 16 | Ht 68.0 in | Wt 160.0 lb

## 2018-03-21 DIAGNOSIS — J189 Pneumonia, unspecified organism: Secondary | ICD-10-CM | POA: Insufficient documentation

## 2018-03-21 DIAGNOSIS — R000 Tachycardia, unspecified: Secondary | ICD-10-CM | POA: Insufficient documentation

## 2018-03-21 DIAGNOSIS — R7989 Other specified abnormal findings of blood chemistry: Secondary | ICD-10-CM

## 2018-03-21 DIAGNOSIS — R945 Abnormal results of liver function studies: Secondary | ICD-10-CM

## 2018-03-21 DIAGNOSIS — D649 Anemia, unspecified: Secondary | ICD-10-CM | POA: Insufficient documentation

## 2018-03-21 HISTORY — DX: Anemia, unspecified: D64.9

## 2018-03-21 NOTE — Assessment & Plan Note (Signed)
May be reactive to mild anemia or recent PNA/deconsitioning, or EtOH hx? No CV or pulm symptoms on review Check TSH No results found for: TSH

## 2018-03-21 NOTE — Patient Instructions (Signed)
It was good to see you today.  We have reviewed your prior records including labs and tests today  Test(s) ordered today. Your results will be released to MyChart (or called to you) after review, usually within 72hours after test completion. If any changes need to be made, you will be notified at that same time.  Medications reviewed and updated, no changes recommended at this time.  Please schedule followup in 3-4 months, call sooner if problems.  

## 2018-03-21 NOTE — Assessment & Plan Note (Signed)
Hosp 4/13-18/19 for same. Completed abx course and feeling well. Afeb, no pulm symptoms and O2 sats ok on RA. 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

## 2018-03-21 NOTE — Progress Notes (Signed)
Subjective:    Patient ID: Christopher Velez, male    DOB: [birth year], 37 y.o.   MRN: [id number]  HPI  Patient here for HFU on pneumonia. Admitted 4/13-18/19 for CAP with acute hypoxic resp failure requiring BiPAP initial hours. Treated with 5 d Tamiflu, also Azith+ Roceph IV as IP. Transitioned to po Augmenting to completed 7d tx (thru 4/24 - now done). Denies shortness of breath or cough. No pleurisy.requests few more days of "rest" before returning to work next week due to mild fatigue, which is slowly improved since DC last week  Monitored for WD symptoms in hospital due to chronic EtOH use. Reports no EtOH or smoking since admission to hospital. "I'm done."  Incidental R liver lobe lesion on Abd [place] when eval transient increased LFTs - felt to represent hemangioma vs fatty infiltrate.  Denies any known prior hx of anemia  Past Medical History:  Diagnosis Date  . Anemia 03/21/2018  . Anginal pain ([place])   . Anxiety   . Dyspnea   . Pneumonia     Review of Systems  Constitutional: Positive for fatigue (mild since PNA/hosp). Negative for activity change, appetite change, chills, fever and unexpected weight change.  Respiratory: Negative for cough, choking, chest tightness and shortness of breath.   Cardiovascular: Negative for chest pain, palpitations and leg swelling.       Objective:    Physical Exam  Constitutional: He appears well-developed and well-nourished. No distress.  Cardiovascular: Normal rate, regular rhythm and normal heart sounds.  No murmur heard. Mild tachy but regular  Pulmonary/Chest: Effort normal and breath sounds normal. No respiratory distress. He has no wheezes. He has no rales.  Abdominal: Soft. Bowel sounds are normal. He exhibits no distension. There is no tenderness.  Skin: No pallor.    BP 137/88   Pulse (!) 113   Temp 99 F (37.2 C) (Oral)   Resp 16   Ht 5\' 8"  (1.727 m)   Wt 160 lb (72.6 kg)   SpO2 93%   BMI 24.33 kg/m  Wt Readings from  Last 3 Encounters:  03/21/18 160 lb (72.6 kg)  03/12/18 169 lb 12.1 oz (77 kg)     Lab Results  Component Value Date   WBC 13.9 (H) 03/15/2018   HGB 10.1 (L) 03/15/2018   HCT 33.3 (L) 03/15/2018   PLT 599 (H) 03/15/2018   GLUCOSE 45 (L) 03/15/2018   ALT 74 (H) 03/15/2018   AST 51 (H) 03/15/2018   NA 135 03/15/2018   K 4.1 03/15/2018   CL 96 (L) 03/15/2018   CREATININE 0.69 03/15/2018   BUN 7 03/15/2018   CO2 25 03/15/2018   INR 1.10 03/10/2018    Dg Chest 2 View  Result Date: 03/10/2018 CLINICAL DATA:  Chest pain and shortness of breath EXAM: CHEST - 2 VIEW COMPARISON:  None. FINDINGS: There is patchy airspace consolidation in the left base and the right middle lobe. Lungs elsewhere clear. Heart size and pulmonary vascularity are normal. No adenopathy. No bone lesions. IMPRESSION: Infiltrate consistent with pneumonia left lower lobe and right middle lobe. Lungs elsewhere clear. No adenopathy evident. Electronically Signed   By: Lowella Grip III M.D.   On: 03/10/2018 15:36   Dg Chest Port 1 View  Result Date: 03/11/2018 CLINICAL DATA:  Community acquired pneumonia. EXAM: PORTABLE CHEST 1 VIEW COMPARISON:  03/10/2018 FINDINGS: The heart size and mediastinal contours are within normal limits. Bibasilar infiltrates show no significant change. No evidence of pleural effusion. IMPRESSION:  Bibasilar pulmonary infiltrates, without significant change. Electronically Signed   By: Earle Gell M.D.   On: 03/11/2018 08:07       Assessment & Plan:   Problem List Items Addressed This Visit    Anemia   Relevant Orders   CBC with Differential/Platelet   Ferritin   Community acquired pneumonia - Primary    Hosp 4/13-18/19 for same. Completed abx course and feeling well. Afeb, no pulm symptoms and O2 sats ok on RA. 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 No tremor or AMS today Encouraged to continue abstinence from future EtOH Abd [place] reviewed - likely hemangioma vs fatty infiltrate not requirng other workup at present - no GI or abd symptoms       Relevant Orders   Hepatic function panel   Basic metabolic panel   Tachycardia    May be reactive to mild anemia or recent PNA/deconsitioning, or EtOH hx? No CV or pulm symptoms on review Check TSH No results found for: TSH       Relevant Orders   TSH   Basic metabolic panel       Gwendolyn Grant, MD

## 2018-03-21 NOTE — Assessment & Plan Note (Signed)
Suspected relationship to (prior?) heavy EtOH use - reports no EtOH use since 4/13 admission. No tremor or AMS today Encouraged to continue abstinence from future EtOH Abd [place] reviewed - likely hemangioma vs fatty infiltrate not requirng other workup at present - no GI or abd symptoms

## 2018-03-22 LAB — CBC WITH DIFFERENTIAL/PLATELET
BASOS: 0 %
Basophils Absolute: 0 10*3/uL (ref 0.0–0.2)
EOS (ABSOLUTE): 0.2 10*3/uL (ref 0.0–0.4)
Eos: 2 %
Hematocrit: 35.2 % — ABNORMAL LOW (ref 37.5–51.0)
Hemoglobin: 10.9 g/dL — ABNORMAL LOW (ref 13.0–17.7)
IMMATURE GRANULOCYTES: 0 %
Immature Grans (Abs): 0 10*3/uL (ref 0.0–0.1)
Lymphocytes Absolute: 2.1 10*3/uL (ref 0.7–3.1)
Lymphs: 22 %
MCH: 25.1 pg — ABNORMAL LOW (ref 26.6–33.0)
MCHC: 31 g/dL — ABNORMAL LOW (ref 31.5–35.7)
MCV: 81 fL (ref 79–97)
MONOS ABS: 0.7 10*3/uL (ref 0.1–0.9)
Monocytes: 8 %
NEUTROS PCT: 68 %
Neutrophils Absolute: 6.4 10*3/uL (ref 1.4–7.0)
Platelets: 629 10*3/uL — ABNORMAL HIGH (ref 150–379)
RBC: 4.35 x10E6/uL (ref 4.14–5.80)
RDW: 14.4 % (ref 12.3–15.4)
WBC: 9.5 10*3/uL (ref 3.4–10.8)

## 2018-03-22 LAB — BASIC METABOLIC PANEL
BUN / CREAT RATIO: 9 (ref 9–20)
BUN: 6 mg/dL (ref 6–20)
CO2: 26 mmol/L (ref 20–29)
Calcium: 9.1 mg/dL (ref 8.7–10.2)
Chloride: 99 mmol/L (ref 96–106)
Creatinine, Ser: 0.65 mg/dL — ABNORMAL LOW (ref 0.76–1.27)
GFR, EST AFRICAN AMERICAN: 144 mL/min/{1.73_m2} (ref >59)
GFR, EST NON AFRICAN AMERICAN: 124 mL/min/{1.73_m2} (ref >59)
Glucose: 94 mg/dL (ref 65–99)
POTASSIUM: 4.2 mmol/L (ref 3.5–5.2)
SODIUM: 140 mmol/L (ref 134–144)

## 2018-03-22 LAB — HEPATIC FUNCTION PANEL
ALT: 28 IU/L (ref 0–44)
AST: 25 IU/L (ref 0–40)
Albumin: 3.7 g/dL (ref 3.5–5.5)
Alkaline Phosphatase: 125 IU/L — ABNORMAL HIGH (ref 39–117)
BILIRUBIN TOTAL: 0.4 mg/dL (ref 0.0–1.2)
Bilirubin, Direct: 0.18 mg/dL (ref 0.00–0.40)
Total Protein: 7.8 g/dL (ref 6.0–8.5)

## 2018-03-22 LAB — FERRITIN: FERRITIN: 852 ng/mL — AB (ref 30–400)

## 2018-03-22 LAB — TSH: TSH: 1.91 u[IU]/mL (ref 0.450–4.500)

## 2018-03-22 NOTE — Progress Notes (Signed)
Please call pt - all labs look good/improved. Still shows mild anemia (but improving since Discharge) so anemia status can be rechecked at next visit; No med or treatment changes needed. Thanks much!

## 2018-03-26 ENCOUNTER — Telehealth: Payer: Self-pay

## 2018-03-26 NOTE — Telephone Encounter (Signed)
Contacted pt to go over lab results pt is aware and doesn't have any questions or concerns 

## 2018-05-21 ENCOUNTER — Ambulatory Visit: Payer: Self-pay | Admitting: Internal Medicine

## 2020-04-06 ENCOUNTER — Ambulatory Visit: Payer: Self-pay | Attending: Internal Medicine

## 2020-04-06 DIAGNOSIS — Z23 Encounter for immunization: Secondary | ICD-10-CM

## 2020-04-06 NOTE — Progress Notes (Signed)
   Covid-19 Vaccination Clinic  Name:  Christopher Velez    MRN: [id number] DOB: [birth year]  04/06/2020  [name] was observed post Covid-19 immunization for 15 minutes without incident. He was provided with Vaccine Information Sheet and instruction to access the V-Safe system.   [name] was instructed to call 911 with any severe reactions post vaccine: Marland Kitchen Difficulty breathing  . Swelling of face and throat  . A fast heartbeat  . A bad rash all over body  . Dizziness and weakness   Immunizations Administered    Name Date Dose VIS Date Route   Pfizer COVID-19 Vaccine 04/06/2020  2:20 PM 0.3 mL 01/22/2019 Intramuscular   Manufacturer: [place]   Lot: [id number]   [place]: [id number]

## 2020-04-12 IMAGING — DX DG CHEST 1V PORT
1 series · 1 of 1 positions shown · non-contrast
Comparison: 03/10/2018

CLINICAL DATA: Community acquired pneumonia.

EXAM:
PORTABLE CHEST 1 VIEW

[chest]
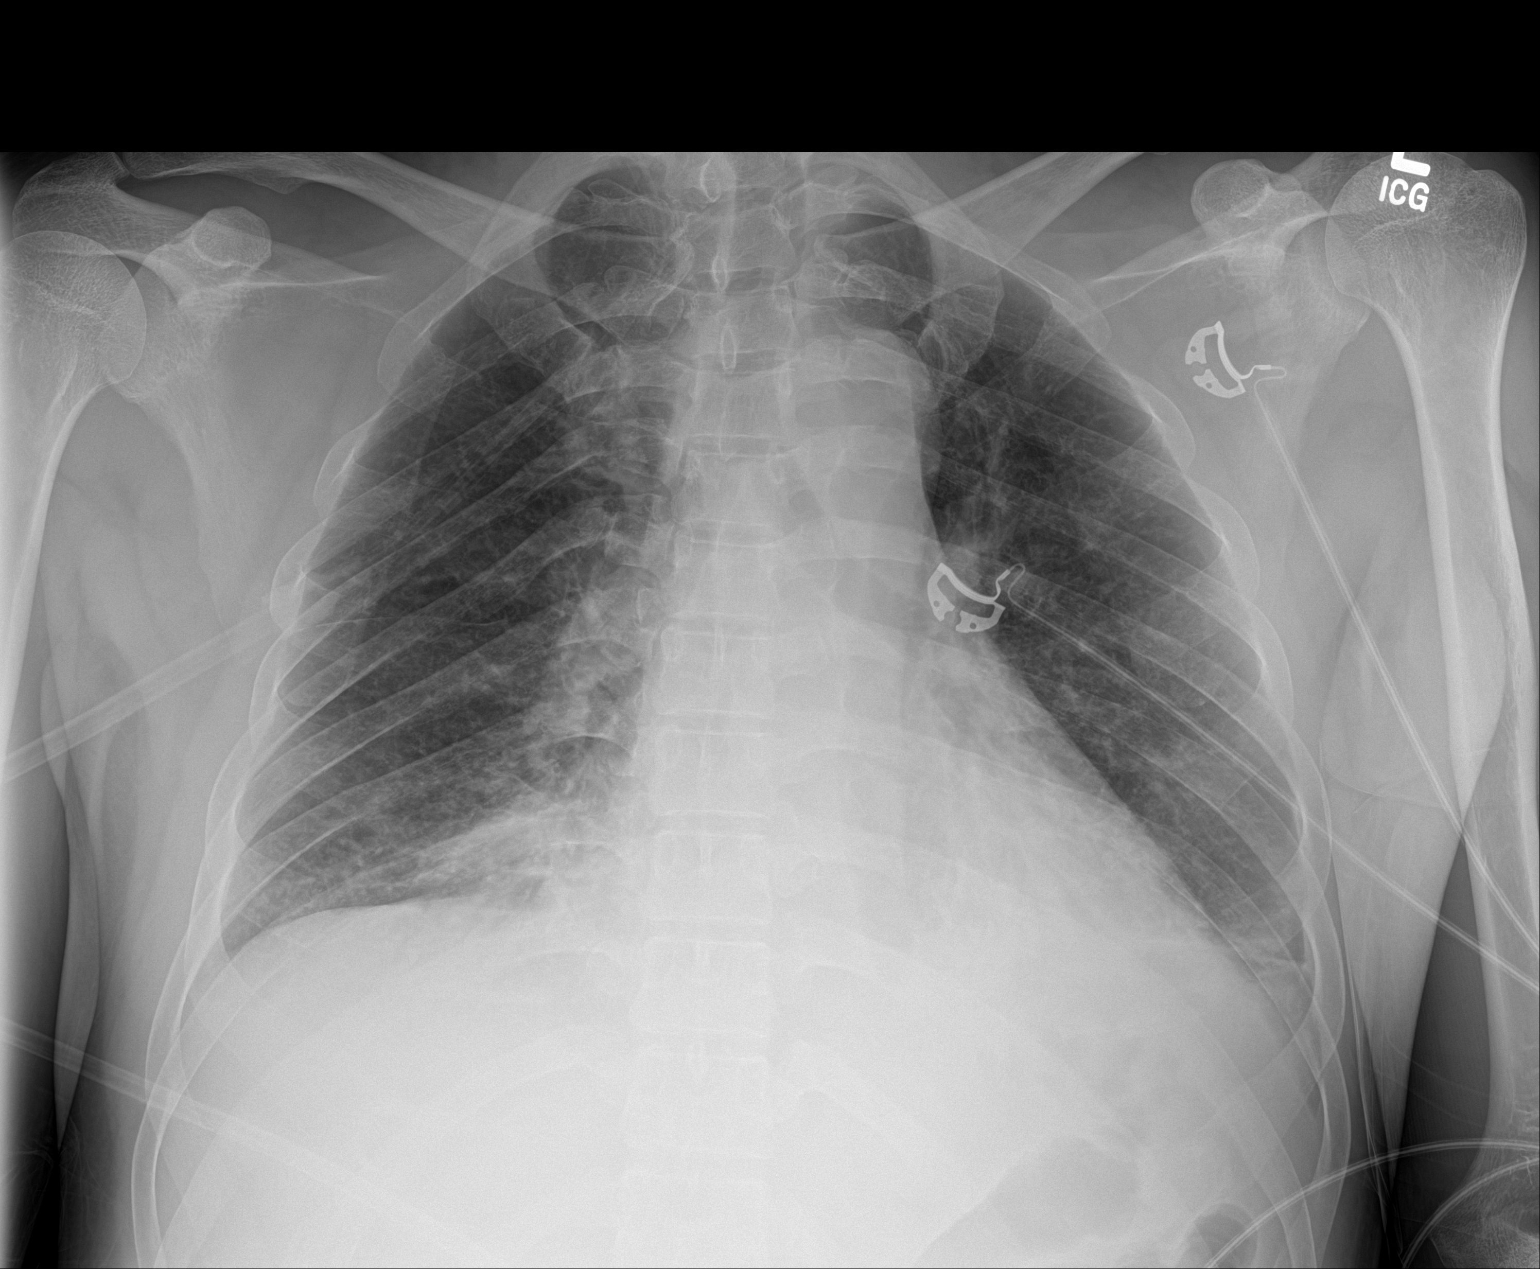

[1 of 1 positions shown; findings below may reference images not displayed]

FINDINGS: The heart size and mediastinal contours are within normal limits.
Bibasilar infiltrates show no significant change. No evidence of
pleural effusion.
IMPRESSION: Bibasilar pulmonary infiltrates, without significant change.

## 2020-04-15 IMAGING — CT CT CHEST W/O CM
2 of 4 series · 15 of 36 positions shown, 18 images · non-contrast
Comparison: Chest radiographs 2228 hours today, and earlier.

CLINICAL DATA: 37-year-old male improving clinically following
hospitalization for shortness of breath, cough and presumed
pneumonia. New small right pleural effusion on radiographs earlier
today, with questionable evidence of a right pneumothorax. The
patient was being considered for possible discharge today.

On discussion by telephone with Dr. RIFOLI RADOUAN we agreed to
follow-up noncontrast chest CT to evaluate the possibility of trace
right pneumothorax.
EXAM:
CT CHEST WITHOUT CONTRAST
TECHNIQUE: Multidetector CT imaging of the chest was performed following the
standard protocol without IV contrast.

[Series 5: chest w/o 3mm st cor · coronal · non-contrast · 0.62mm/px · 3 of 88 slices shown]
[im 18/88  lung]
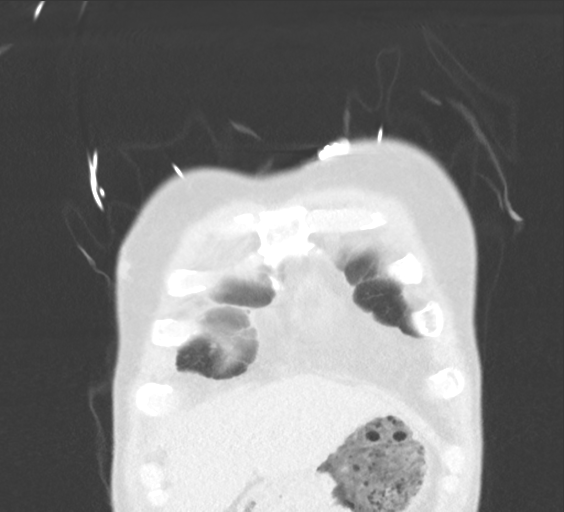
[im 35/88  lung]
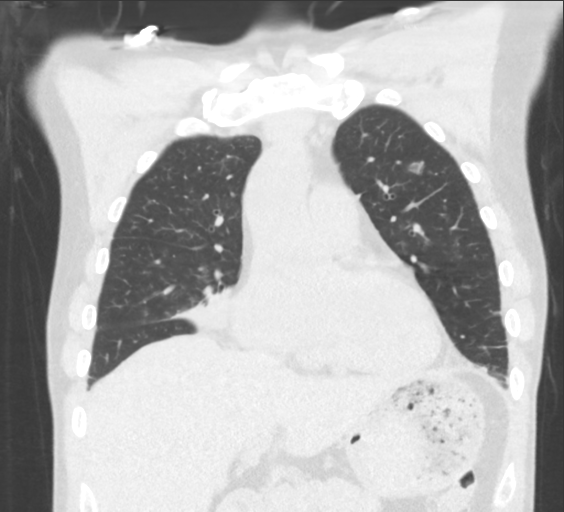
[im 53/88  lung]
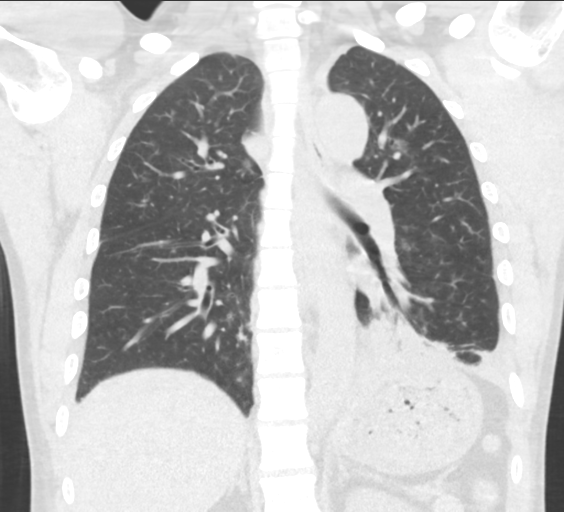

[Series 7: chest w/o 1mm st · axial · non-contrast · 0.79mm/px · z∈[+1198,+1480]mm · 12 of 395 slices shown, 15 images]
[im 21/395  mediastinal]
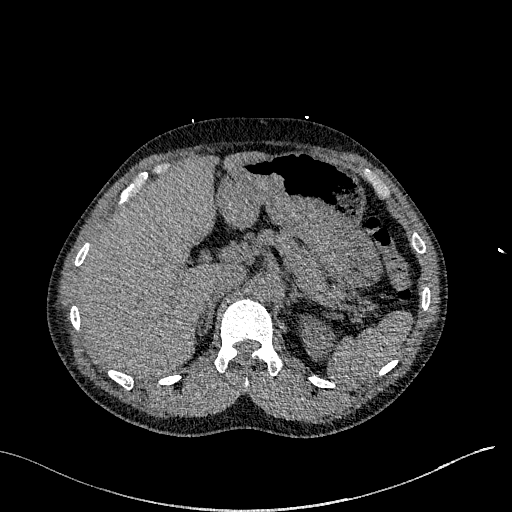
[im 21/395  lung]
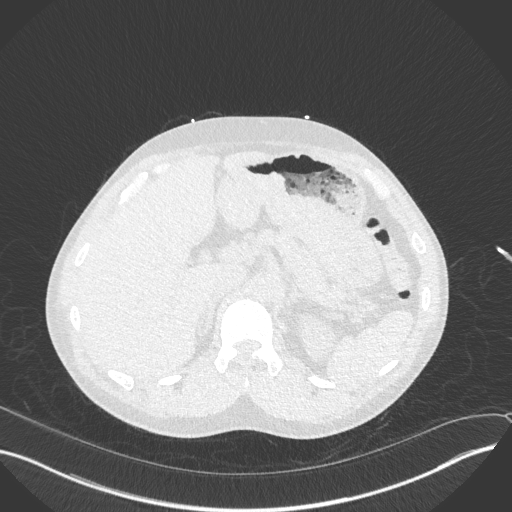
[im 63/395  lung]
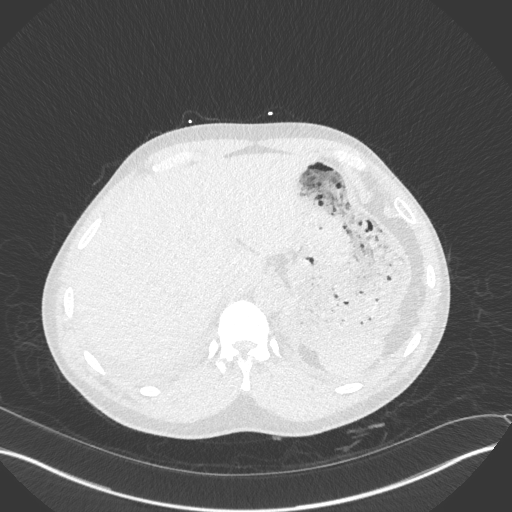
[im 83/395  lung]
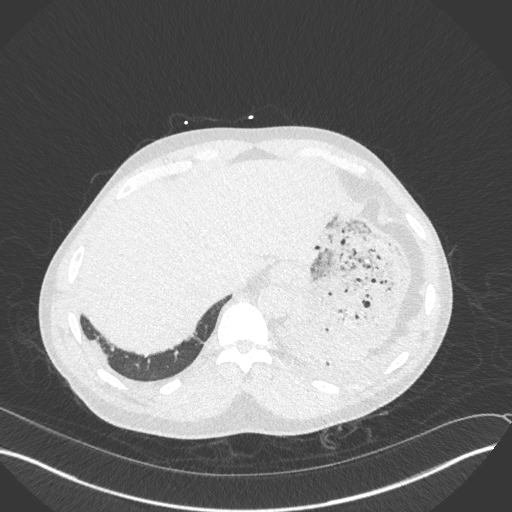
[im 125/395  lung]
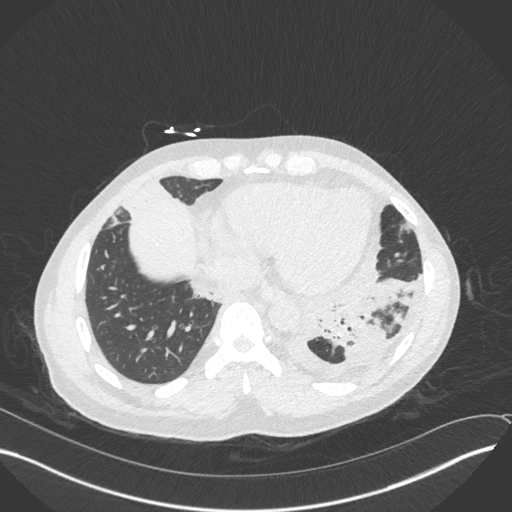
[im 146/395  mediastinal]
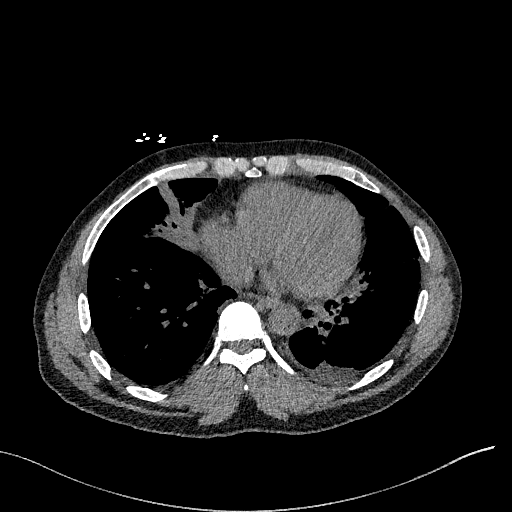
[im 146/395  lung]
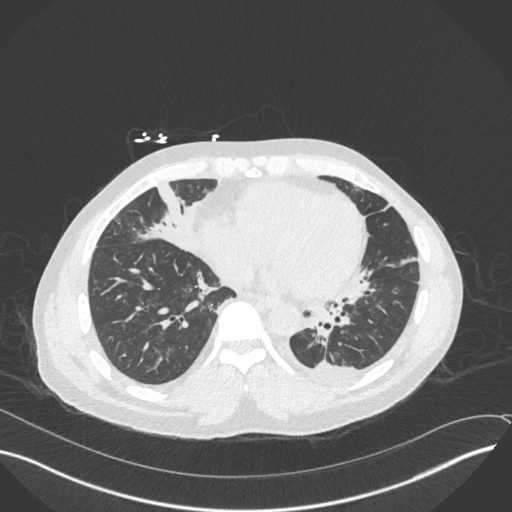
[im 187/395  lung]
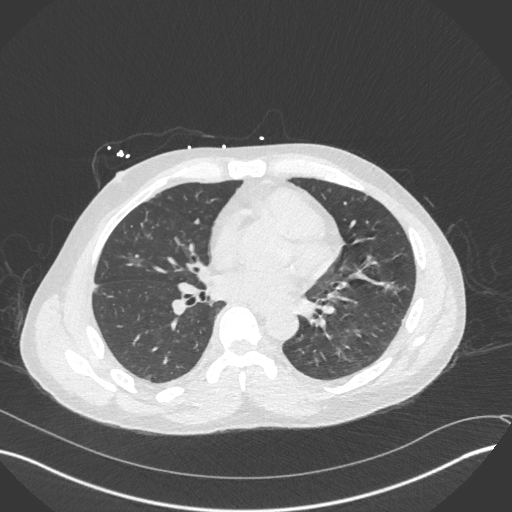
[im 208/395  lung]
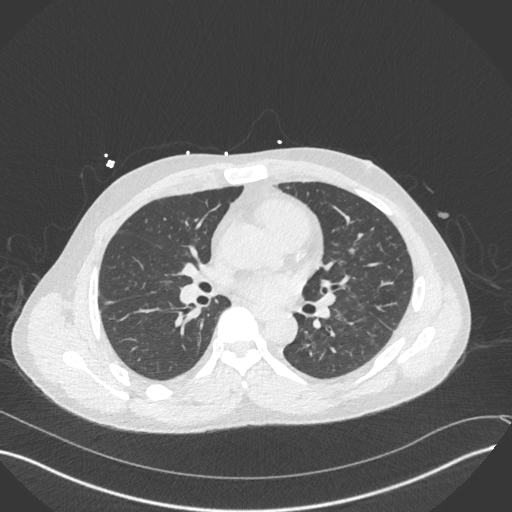
[im 249/395  lung]
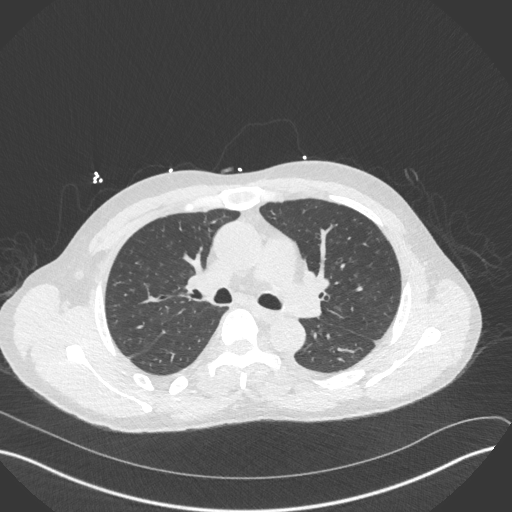
[im 270/395  mediastinal]
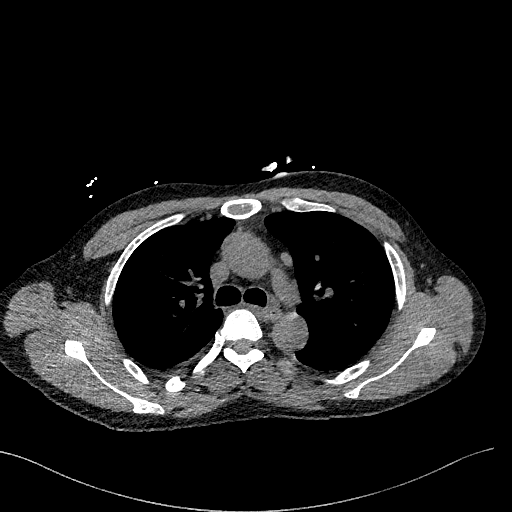
[im 270/395  lung]
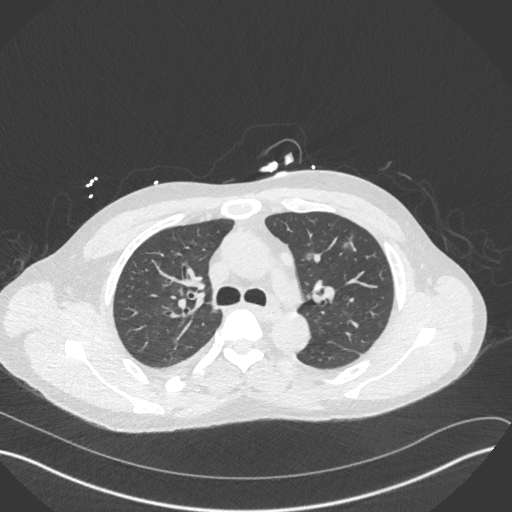
[im 312/395  lung]
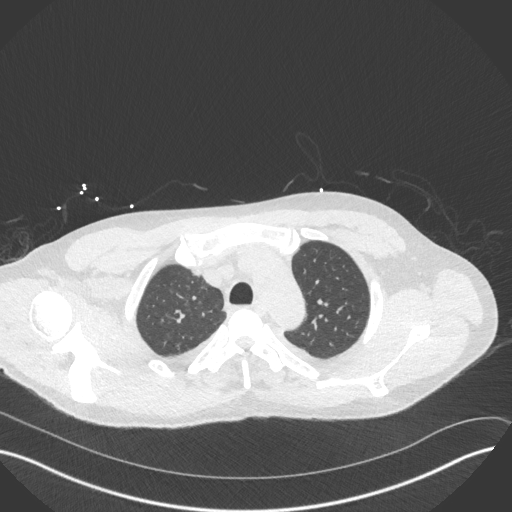
[im 332/395  lung]
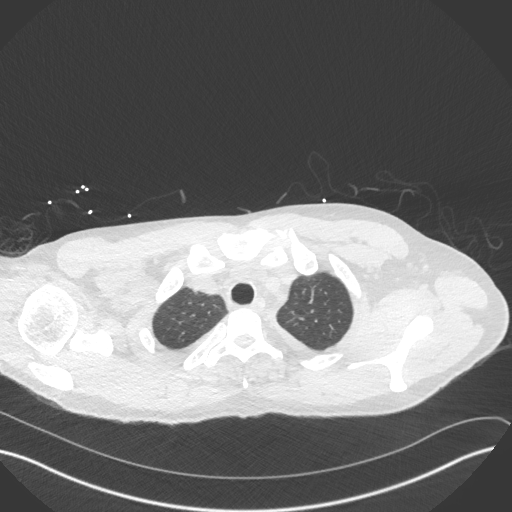
[im 374/395  lung]
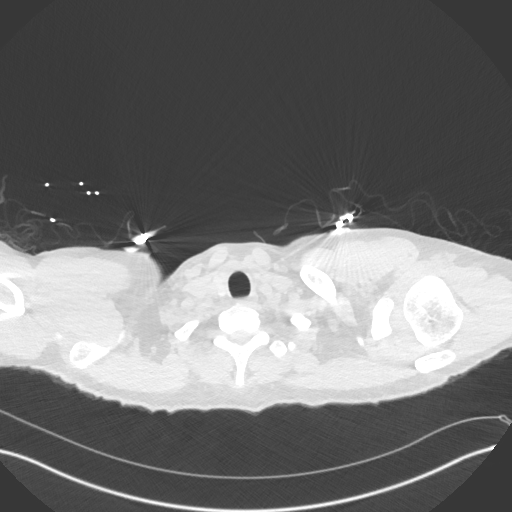

[15 of 36 positions shown; findings below may reference images not displayed]

FINDINGS: Cardiovascular: Calcified left coronary artery atherosclerosis is
evident. Vascular patency is not evaluated in the absence of IV
contrast. No cardiomegaly or pericardial effusion.

Mediastinum/Nodes: No mediastinal lymphadenopathy. No hilar
lymphadenopathy is evident.

Lungs/Pleura: The major airways are patent.

No pneumothorax.  Small or trace right pleural effusion.

There is scattered, mostly sub solid ground-glass type peribronchial
nodularity in some of the inferior aspect of the right lower lobe.
Similar mild peribronchial nodular opacity in the lateral basal
segment of the right middle lobe. Confluent medial basal segment
middle lobe consolidation. There is a small area of consolidation
also in the medial basal segment of the right lower lobe, with
otherwise mild right lower lobe peribronchial nodularity.

There is also a trace layering left pleural effusion. No left
pneumothorax.

There is similar scattered peribronchial ground-glass nodularity in
the left upper lobe and lingula. There is confluent left lower lobe
peribronchial and costophrenic angle consolidation.

Upper Abdomen: Negative visible noncontrast liver, spleen, pancreas,
adrenal glands, and left renal upper pole in the upper abdomen.
Small splenule (normal variant) incidentally noted along the medial
spleen. Negative visible bowel in the upper abdomen.

Musculoskeletal: No acute osseous abnormality identified. There is a
chronic posterior right 11th rib fracture.
IMPRESSION: 1. Negative for pneumothorax. There are small right and trace left
layering pleural effusions.
2. Multilobar peribronchial consolidation compatible with bilateral
multifocal pneumonia. Right middle lobe, left lower lobe, and to a
lesser extent right lower lobe involvement.
3. Additional mild scattered peribronchial ground-glass nodularity
in all lobes also felt related to the acute infectious process.
4. Calcified left coronary artery atherosclerosis. No pericardial
effusion.
